# Patient Record
Sex: Female | Born: 1960 | Race: White | Hispanic: No | Marital: Single | Smoking: Former smoker
Health system: Southern US, Community
[De-identification: ages and names within clinical notes are randomized; demographics above are authoritative.]

## PROBLEM LIST (undated history)

## (undated) DIAGNOSIS — T884XXA Failed or difficult intubation, initial encounter: Secondary | ICD-10-CM

## (undated) DIAGNOSIS — M1990 Unspecified osteoarthritis, unspecified site: Secondary | ICD-10-CM

## (undated) DIAGNOSIS — F419 Anxiety disorder, unspecified: Secondary | ICD-10-CM

## (undated) HISTORY — PX: CHOLECYSTECTOMY: SHX55

## (undated) HISTORY — PX: TUBAL LIGATION: SHX77

---

## 2005-03-27 ENCOUNTER — Emergency Department: Payer: Self-pay | Admitting: Unknown Physician Specialty

## 2009-12-21 ENCOUNTER — Ambulatory Visit: Payer: Self-pay

## 2013-03-05 ENCOUNTER — Ambulatory Visit: Payer: Self-pay

## 2013-04-02 ENCOUNTER — Ambulatory Visit: Payer: Self-pay

## 2014-06-10 ENCOUNTER — Ambulatory Visit: Payer: Self-pay

## 2015-06-23 ENCOUNTER — Ambulatory Visit: Payer: Self-pay | Attending: Oncology

## 2015-06-23 ENCOUNTER — Ambulatory Visit
Admission: RE | Admit: 2015-06-23 | Discharge: 2015-06-23 | Disposition: A | Discharge status: Home / Self Care | Payer: Self-pay | Source: Ambulatory Visit | Attending: Oncology | Admitting: Oncology

## 2015-06-23 ENCOUNTER — Other Ambulatory Visit: Payer: Self-pay | Admitting: Oncology

## 2015-06-23 VITALS — BP 138/65 | HR 106 | Temp 98.9°F | Ht 60.24 in | Wt 147.7 lb

## 2015-06-23 DIAGNOSIS — Z0000 Encounter for general adult medical examination without abnormal findings: Secondary | ICD-10-CM

## 2015-06-23 NOTE — Progress Notes (Addendum)
Subjective:     Patient ID: Teresa Dudley, female   DOB: 06-15-1960, 55 y.o.   MRN: [id number]  HPI   Review of Systems     Objective:   Physical Exam  Pulmonary/Chest: Right breast exhibits no inverted nipple, no mass, no nipple discharge, no skin change and no tenderness. Left breast exhibits no inverted nipple, no mass, no nipple discharge, no skin change and no tenderness. Breasts are symmetrical.  Genitourinary: No labial fusion. There is no rash, tenderness, lesion or injury on the right labia. There is no rash, tenderness, lesion or injury on the left labia. Uterus is not deviated, not enlarged, not fixed and not tender. Cervix exhibits no motion tenderness, no discharge and no friability. Right adnexum displays no mass, no tenderness and no fullness. Left adnexum displays no mass, no tenderness and no fullness. No erythema, tenderness or bleeding in the vagina. No foreign body around the vagina. No signs of injury around the vagina. No vaginal discharge found.       Assessment:     55 year old female returns for BCCCP screening. Patient still works at [employer].  Patient screened, and meets BCCCP eligibility.  Patient does not have insurance, Medicare or Medicaid.  Handout given on Affordable Care Act.Instructed patient on breast self-exam using teach back method.  CBE unremarkable .  No mass or lump palpated.  Slight inversion of left areola when arms elevated.  Patient reports this is normal for her.   Pelvic exam normal.  Patient will be due for pap next year. Plan:     Sent for bilateral screening mammogram.

## 2015-06-29 NOTE — Progress Notes (Signed)
Letter mailed from [facility] to notify of normal mammogram results.  Patient to return in one year for annual screening.  Copy to [facility]. 

## 2015-12-19 ENCOUNTER — Encounter
Admission: EM | Disposition: A | Discharge status: Home / Self Care | Payer: Self-pay | Source: Home / Self Care | Attending: Student

## 2015-12-19 ENCOUNTER — Inpatient Hospital Stay
Admission: EM | Admit: 2015-12-19 | Discharge: 2015-12-21 | DRG: 983 | Disposition: A | Discharge status: Home / Self Care | Payer: BLUE CROSS/BLUE SHIELD | Attending: Student | Admitting: Student

## 2015-12-19 ENCOUNTER — Inpatient Hospital Stay: Payer: BLUE CROSS/BLUE SHIELD | Admitting: Anesthesiology

## 2015-12-19 ENCOUNTER — Emergency Department: Payer: BLUE CROSS/BLUE SHIELD

## 2015-12-19 ENCOUNTER — Encounter: Payer: Self-pay | Admitting: Emergency Medicine

## 2015-12-19 ENCOUNTER — Ambulatory Visit
Admission: EM | Admit: 2015-12-19 | Discharge: 2015-12-19 | Disposition: A | Discharge status: Home / Self Care | Payer: BLUE CROSS/BLUE SHIELD | Source: Home / Self Care

## 2015-12-19 DIAGNOSIS — W5501XA Bitten by cat, initial encounter: Secondary | ICD-10-CM

## 2015-12-19 DIAGNOSIS — Z23 Encounter for immunization: Secondary | ICD-10-CM

## 2015-12-19 DIAGNOSIS — S61254A Open bite of right ring finger without damage to nail, initial encounter: Principal | ICD-10-CM | POA: Diagnosis present

## 2015-12-19 DIAGNOSIS — M65841 Other synovitis and tenosynovitis, right hand: Secondary | ICD-10-CM | POA: Diagnosis present

## 2015-12-19 DIAGNOSIS — M6588 Other synovitis and tenosynovitis, other site: Secondary | ICD-10-CM | POA: Diagnosis present

## 2015-12-19 DIAGNOSIS — M659 Synovitis and tenosynovitis, unspecified: Secondary | ICD-10-CM | POA: Diagnosis present

## 2015-12-19 HISTORY — PX: INCISION AND DRAINAGE OF WOUND: SHX1803

## 2015-12-19 LAB — COMPREHENSIVE METABOLIC PANEL
ALK PHOS: 137 U/L — AB (ref 38–126)
ALT: 24 U/L (ref 14–54)
ANION GAP: 10 (ref 5–15)
AST: 32 U/L (ref 15–41)
Albumin: 4.5 g/dL (ref 3.5–5.0)
BILIRUBIN TOTAL: 1 mg/dL (ref 0.3–1.2)
BUN: 12 mg/dL (ref 6–20)
CALCIUM: 9.2 mg/dL (ref 8.9–10.3)
CO2: 22 mmol/L (ref 22–32)
Chloride: 105 mmol/L (ref 101–111)
Creatinine, Ser: 0.63 mg/dL (ref 0.44–1.00)
GFR calc Af Amer: >60 mL/min (ref >60)
Glucose, Bld: 104 mg/dL — ABNORMAL HIGH (ref 65–99)
POTASSIUM: 4 mmol/L (ref 3.5–5.1)
Sodium: 137 mmol/L (ref 135–145)
TOTAL PROTEIN: 8 g/dL (ref 6.5–8.1)

## 2015-12-19 LAB — CBC WITH DIFFERENTIAL/PLATELET
BASOS PCT: 0 %
Basophils Absolute: 0 10*3/uL (ref 0–0.1)
Eosinophils Absolute: 0 10*3/uL (ref 0–0.7)
Eosinophils Relative: 0 %
HEMATOCRIT: 41.1 % (ref 35.0–47.0)
Hemoglobin: 13.9 g/dL (ref 12.0–16.0)
LYMPHS ABS: 1.6 10*3/uL (ref 1.0–3.6)
LYMPHS PCT: 11 %
MCH: 28.8 pg (ref 26.0–34.0)
MCHC: 33.8 g/dL (ref 32.0–36.0)
MCV: 85.2 fL (ref 80.0–100.0)
MONO ABS: 0.8 10*3/uL (ref 0.2–0.9)
MONOS PCT: 5 %
NEUTROS ABS: 12.4 10*3/uL — AB (ref 1.4–6.5)
Neutrophils Relative %: 84 %
Platelets: 277 10*3/uL (ref 150–440)
RBC: 4.83 MIL/uL (ref 3.80–5.20)
RDW: 13.5 % (ref 11.5–14.5)
WBC: 14.8 10*3/uL — ABNORMAL HIGH (ref 3.6–11.0)

## 2015-12-19 SURGERY — IRRIGATION AND DEBRIDEMENT WOUND
Anesthesia: General | Laterality: Right

## 2015-12-19 MED ORDER — DEXAMETHASONE SODIUM PHOSPHATE 10 MG/ML IJ SOLN
INTRAMUSCULAR | Status: DC | PRN
  Administered 2015-12-19: 10 mg via INTRAVENOUS

## 2015-12-19 MED ORDER — FENTANYL CITRATE (PF) 100 MCG/2ML IJ SOLN
INTRAMUSCULAR | Status: AC
  Filled 2015-12-19: qty 2

## 2015-12-19 MED ORDER — TETANUS-DIPHTH-ACELL PERTUSSIS 5-2.5-18.5 LF-MCG/0.5 IM SUSP
0.5000 mL | Freq: Once | INTRAMUSCULAR | Status: AC
  Administered 2015-12-19: 0.5 mL via INTRAMUSCULAR
  Filled 2015-12-19 (×2): qty 0.5

## 2015-12-19 MED ORDER — ONDANSETRON HCL 4 MG/2ML IJ SOLN: 4.0000 mg | Freq: Once | INTRAMUSCULAR | Status: DC | PRN

## 2015-12-19 MED ORDER — LACTATED RINGERS IV SOLN
INTRAVENOUS | Status: DC | PRN
  Administered 2015-12-19: 18:00:00 via INTRAVENOUS

## 2015-12-19 MED ORDER — PROPOFOL 10 MG/ML IV BOLUS
INTRAVENOUS | Status: DC | PRN
  Administered 2015-12-19: 25 mg via INTRAVENOUS
  Administered 2015-12-19: 150 mg via INTRAVENOUS
  Administered 2015-12-19: 25 mg via INTRAVENOUS

## 2015-12-19 MED ORDER — TRAZODONE HCL 100 MG PO TABS
100.0000 mg | ORAL_TABLET | Freq: Every day | ORAL | Status: DC
  Administered 2015-12-19 – 2015-12-20 (×2): 100 mg via ORAL
  Filled 2015-12-19 (×2): qty 1

## 2015-12-19 MED ORDER — MIDAZOLAM HCL 2 MG/2ML IJ SOLN
INTRAMUSCULAR | Status: DC | PRN
  Administered 2015-12-19: 2 mg via INTRAVENOUS

## 2015-12-19 MED ORDER — ONDANSETRON HCL 4 MG/2ML IJ SOLN
INTRAMUSCULAR | Status: DC | PRN
  Administered 2015-12-19: 4 mg via INTRAVENOUS

## 2015-12-19 MED ORDER — MORPHINE SULFATE (PF) 2 MG/ML IV SOLN: 2.0000 mg | INTRAVENOUS | Status: DC | PRN

## 2015-12-19 MED ORDER — DEXTROSE-NACL 5-0.45 % IV SOLN
INTRAVENOUS | Status: DC
  Administered 2015-12-19: 20:00:00 via INTRAVENOUS

## 2015-12-19 MED ORDER — SODIUM CHLORIDE 0.9 % IV SOLN
3.0000 g | Freq: Four times a day (QID) | INTRAVENOUS | Status: DC
  Administered 2015-12-19 – 2015-12-21 (×5): 3 g via INTRAVENOUS
  Filled 2015-12-19 (×10): qty 3

## 2015-12-19 MED ORDER — FENTANYL CITRATE (PF) 100 MCG/2ML IJ SOLN
25.0000 ug | INTRAMUSCULAR | Status: DC | PRN
  Administered 2015-12-19 (×4): 25 ug via INTRAVENOUS

## 2015-12-19 MED ORDER — KETOROLAC TROMETHAMINE 15 MG/ML IJ SOLN
15.0000 mg | Freq: Four times a day (QID) | INTRAMUSCULAR | Status: DC
Start: 2015-12-19 — End: 2015-12-21
  Administered 2015-12-20 – 2015-12-21 (×5): 15 mg via INTRAVENOUS
  Filled 2015-12-19 (×6): qty 1

## 2015-12-19 MED ORDER — VANCOMYCIN HCL IN DEXTROSE 1-5 GM/200ML-% IV SOLN: 1000.0000 mg | Freq: Two times a day (BID) | INTRAVENOUS | Status: DC

## 2015-12-19 MED ORDER — FENTANYL CITRATE (PF) 100 MCG/2ML IJ SOLN
INTRAMUSCULAR | Status: DC | PRN
  Administered 2015-12-19 (×3): 50 ug via INTRAVENOUS

## 2015-12-19 MED ORDER — SODIUM CHLORIDE 0.9 % IV SOLN
3.0000 g | INTRAVENOUS | Status: AC
  Administered 2015-12-19: 3 g via INTRAVENOUS
  Filled 2015-12-19 (×2): qty 3

## 2015-12-19 MED ORDER — VANCOMYCIN HCL IN DEXTROSE 750-5 MG/150ML-% IV SOLN
750.0000 mg | Freq: Two times a day (BID) | INTRAVENOUS | Status: DC
  Administered 2015-12-20 – 2015-12-21 (×3): 750 mg via INTRAVENOUS
  Filled 2015-12-19 (×5): qty 150

## 2015-12-19 MED ORDER — OXYCODONE HCL 5 MG PO TABS
5.0000 mg | ORAL_TABLET | ORAL | Status: DC | PRN
  Administered 2015-12-20 – 2015-12-21 (×3): 5 mg via ORAL
  Filled 2015-12-19 (×3): qty 1

## 2015-12-19 MED ORDER — VANCOMYCIN HCL IN DEXTROSE 1-5 GM/200ML-% IV SOLN
1000.0000 mg | Freq: Once | INTRAVENOUS | Status: AC
Start: 2015-12-19 — End: 2015-12-19
  Administered 2015-12-19: 1000 mg via INTRAVENOUS
  Filled 2015-12-19: qty 200

## 2015-12-19 MED ORDER — LIDOCAINE HCL (CARDIAC) 20 MG/ML IV SOLN
INTRAVENOUS | Status: DC | PRN
  Administered 2015-12-19: 100 mg via INTRAVENOUS

## 2015-12-19 MED ORDER — ACETAMINOPHEN 325 MG PO TABS
650.0000 mg | ORAL_TABLET | Freq: Four times a day (QID) | ORAL | Status: DC | PRN
  Administered 2015-12-20: 650 mg via ORAL
  Filled 2015-12-19: qty 2

## 2015-12-19 MED ORDER — KETOROLAC TROMETHAMINE 30 MG/ML IJ SOLN
INTRAMUSCULAR | Status: DC | PRN
  Administered 2015-12-19: 30 mg via INTRAVENOUS

## 2015-12-19 SURGICAL SUPPLY — 28 items
BANDAGE CONFORM 2X5YD N/S (GAUZE/BANDAGES/DRESSINGS) IMPLANT
BANDAGE ELASTIC 3 LF NS (GAUZE/BANDAGES/DRESSINGS) IMPLANT
BLADE SURG MINI STRL (BLADE) ×2 IMPLANT
BNDG COHESIVE 6X5 TAN STRL LF (GAUZE/BANDAGES/DRESSINGS) ×2 IMPLANT
BNDG ESMARK 4X12 TAN STRL LF (GAUZE/BANDAGES/DRESSINGS) ×2 IMPLANT
CANISTER SUCT 1200ML W/VALVE (MISCELLANEOUS) ×2 IMPLANT
CAST PADDING 3X4FT ST 30246 (SOFTGOODS)
CHLORAPREP W/TINT 26ML (MISCELLANEOUS) ×2 IMPLANT
CUFF TOURN 18 STER (MISCELLANEOUS) ×2 IMPLANT
DRAPE U-SHAPE 47X51 STRL (DRAPES) ×2 IMPLANT
ELECT REM PT RETURN 9FT ADLT (ELECTROSURGICAL) ×2
ELECTRODE REM PT RTRN 9FT ADLT (ELECTROSURGICAL) ×1 IMPLANT
GAUZE PETRO XEROFOAM 1X8 (MISCELLANEOUS) IMPLANT
GAUZE SPONGE 4X4 12PLY STRL (GAUZE/BANDAGES/DRESSINGS) ×2 IMPLANT
GLOVE BIO SURGEON STRL SZ7.5 (GLOVE) ×8 IMPLANT
GOWN STRL REUS W/ TWL LRG LVL3 (GOWN DISPOSABLE) ×2 IMPLANT
GOWN STRL REUS W/TWL LRG LVL3 (GOWN DISPOSABLE) ×2
IV CATH ANGIO 14GX3.25 ORG (MISCELLANEOUS) ×6 IMPLANT
KIT RM TURNOVER STRD PROC AR (KITS) ×2 IMPLANT
NS IRRIG 500ML POUR BTL (IV SOLUTION) ×2 IMPLANT
PACK EXTREMITY ARMC (MISCELLANEOUS) ×2 IMPLANT
PAD CAST CTTN 3X4 STRL (SOFTGOODS) IMPLANT
PAD PREP 24X41 OB/GYN DISP (PERSONAL CARE ITEMS) ×2 IMPLANT
STOCKINETTE STRL 4IN 9604848 (GAUZE/BANDAGES/DRESSINGS) ×2 IMPLANT
SUT ETHILON 3-0 FS-10 30 BLK (SUTURE) ×2
SUT ETHILON 4 0 P 3 18 (SUTURE) IMPLANT
SUTURE EHLN 3-0 FS-10 30 BLK (SUTURE) ×1 IMPLANT
SYR 50ML LL SCALE MARK (SYRINGE) ×2 IMPLANT

## 2015-12-19 NOTE — Anesthesia Procedure Notes (Signed)
Procedure Name: LMA Insertion Date/Time: 12/19/2015 6:12 PM Performed by: Junious Silk Pre-anesthesia Checklist: Patient identified, Patient being monitored, Timeout performed, Emergency Drugs available and Suction available Patient Re-evaluated:Patient Re-evaluated prior to inductionOxygen Delivery Method: Circle system utilized Preoxygenation: Pre-oxygenation with 100% oxygen Intubation Type: IV induction Ventilation: Mask ventilation without difficulty LMA: LMA inserted LMA Size: 3.5 Tube type: Oral Number of attempts: 1 Placement Confirmation: positive ETCO2 and breath sounds checked- equal and bilateral Tube secured with: Tape Dental Injury: Teeth and Oropharynx as per pre-operative assessment

## 2015-12-19 NOTE — ED Notes (Signed)
[place] [employer] notified via [employer] of animal bite.

## 2015-12-19 NOTE — Anesthesia Preprocedure Evaluation (Signed)
Anesthesia Evaluation  Patient identified by MRN, date of birth, ID band Patient awake    Reviewed: Allergy & Precautions, H&P , NPO status , Patient's Chart, lab work & pertinent test results, reviewed documented beta blocker date and time   Airway Mallampati: II  TM Distance: >3 FB Neck ROM: full    Dental  (+) Teeth Intact   Pulmonary neg pulmonary ROS,    Pulmonary exam normal        Cardiovascular Exercise Tolerance: Good negative cardio ROS Normal cardiovascular exam Rate:Normal     Neuro/Psych negative neurological ROS  negative psych ROS   GI/Hepatic negative GI ROS, Neg liver ROS,   Endo/Other  negative endocrine ROS  Renal/GU negative Renal ROS  negative genitourinary   Musculoskeletal   Abdominal   Peds  Hematology negative hematology ROS (+)   Anesthesia Other Findings   Reproductive/Obstetrics negative OB ROS                             Anesthesia Physical Anesthesia Plan  ASA: II and emergent  Anesthesia Plan: General LMA   Post-op Pain Management:    Induction:   Airway Management Planned:   Additional Equipment:   Intra-op Plan:   Post-operative Plan:   Informed Consent: I have reviewed the patients History and Physical, chart, labs and discussed the procedure including the risks, benefits and alternatives for the proposed anesthesia with the patient or authorized representative who has indicated his/her understanding and acceptance.     Plan Discussed with: CRNA  Anesthesia Plan Comments:         Anesthesia Quick Evaluation

## 2015-12-19 NOTE — Progress Notes (Signed)
Patient arrived to floor from procedure. Patient alert and oriented x4. Dressing CDI. Ice pack applied and elevated on pillows. IV fluids infusing.

## 2015-12-19 NOTE — Progress Notes (Signed)
Pharmacy Antibiotic Note  Teresa Dudley is a 55 y.o. female admitted on 12/19/2015 with wound infecton (cat bite).  Pharmacy has been consulted for vancomycin dosing.  Plan: Patient received vancomycin 1000 mg dose in ED. Will follow with vancomycin 750 mg IV q12h (10 hour stacked dose) Goal vancomycin trough 15-20 mcg/mL Vancomycin trough scheduled for 8/1 @ 1230, which is prior to 5th dose and should represent steady state.  Height: 4\' 11"  (149.9 cm) Weight: 135 lb (61.2 kg) IBW/kg (Calculated) : 43.2  Temp (24hrs), Avg:98.7 F (37.1 C), Min:98.1 F (36.7 C), Max:99.5 F (37.5 C)   Recent Labs Lab 12/19/15 1239  WBC 14.8*  CREATININE 0.63    Estimated Creatinine Clearance: 64.7 mL/min (by C-G formula based on SCr of 0.8 mg/dL).    No Known Allergies  Antimicrobials this admission: Ampicillin/sulbactam 7/30 >>  vancomycin 7/30 >>   Dose adjustments this admission:  Microbiology results:  Thank you for allowing pharmacy to be a part of this patient's care.  Cindi Carbon, PharmD 12/19/2015 8:58 PM

## 2015-12-19 NOTE — ED Provider Notes (Signed)
[facility] Emergency Department Provider Note  ____________________________________________   First MD Initiated Contact with Patient 12/19/15 1405     (approximate)  I have reviewed the triage vital signs and the nursing notes.   HISTORY  Chief Complaint Animal Bite    HPI Teresa Dudley is a 55 y.o. female who denies any significant chronic medical history and presents for evaluation of a cat bite on her right hand.  She reports that her cat is elderly, lives in a house with her at all times and has no contact with other animals, and was startled when she went to pick it up last night.  The cat bit her directly and deeply on the PIP joint of her right ring finger.  She states that the cat "really dug in" and that deeply into the joint.  She has some superficial scratches of her hand as well.  She washed the wound thoroughly and applied some triple antibiotic ointment, but she was awake all night because of the aching and throbbing of her hand.  Relatively rapidly over the last 12 hours the finger has swollen and turned red and is held in slight flexion.  Additionally the MCP joint of the affected finger and the surrounding fingers are now red and swollen and tender as well.  She reports the pain is severe and worse with any movement of the hand.  She denies fever/chills, chest pain, shortness of breath, nausea, vomiting.  Her last tetanus vaccination was 11 years ago.  The emergency department nurse is coordinating with the sheriff's department/animal control regarding the incident and a questionable need for rabies vaccination.  The patient is left-hand dominant.   History reviewed. No pertinent past medical history.  There are no active problems to display for this patient.   Past Surgical History:  Procedure Laterality Date  . CHOLECYSTECTOMY    . TUBAL LIGATION      Prior to Admission medications   Medication Sig Start Date End Date Taking?  Authorizing Provider  naproxen (NAPROSYN) 500 MG tablet Take 500 mg by mouth every 12 (twelve) hours as needed.   Yes Historical Provider, MD  traZODone (DESYREL) 100 MG tablet Take 100 mg by mouth at bedtime.   Yes Historical Provider, MD    Allergies Review of patient's allergies indicates no known allergies.  Family History  Problem Relation Age of Onset  . Breast cancer Daughter 3    Social History Social History  Substance Use Topics  . Smoking status: Never Smoker  . Smokeless tobacco: Never Used  . Alcohol use No    Review of Systems Constitutional: No fever/chills Eyes: No visual changes. ENT: No sore throat. Cardiovascular: Denies chest pain. Respiratory: Denies shortness of breath. Gastrointestinal: No abdominal pain.  No nausea, no vomiting.  No diarrhea.  No constipation. Genitourinary: Negative for dysuria. Musculoskeletal: Rapidly worsening pain, redness, swelling in right hand after cat bite to ring finger PIP about 13 hours ago Skin: Negative for rash. Neurological: Negative for headaches, focal weakness or numbness.  10-point ROS otherwise negative.  ____________________________________________   PHYSICAL EXAM:  VITAL SIGNS: ED Triage Vitals  Enc Vitals Group     BP 12/19/15 1229 (!) 150/95     Pulse Rate 12/19/15 1229 (!) 103     Resp 12/19/15 1229 20     Temp 12/19/15 1229 99 F (37.2 C)     Temp Source 12/19/15 1229 Oral     SpO2 12/19/15 1229 96 %  Weight 12/19/15 1229 135 lb (61.2 kg)     Height 12/19/15 1229  (1.499 m)     Head Circumference --      Peak Flow --      Pain Score 12/19/15 1241 10     Pain Loc --      Pain Edu? --      Excl. in GC? --     Constitutional: Alert and oriented. Well appearing and in no acute distress but does appear uncomfortable Eyes: Conjunctivae are normal. PERRL. EOMI. Head: Atraumatic. Nose: No congestion/rhinnorhea. Neck: No stridor.  No meningeal signs.   Cardiovascular: Normal rate,  regular rhythm. Good peripheral circulation. Grossly normal heart sounds.   Respiratory: Normal respiratory effort.  No retractions. Lungs CTAB. Gastrointestinal: Soft and nontender. No distention.  Musculoskeletal: circumferential swelling nd erythemaof the right ring finger with obvious bite mark directly on the PIP.  The swelling extends distally to the DIP and proximally past the MCP.  She is holding the finger in flexion and there is severe pain with passive extension.  There is tenderness to palpation throughout the finger along the tendon sheath.  The redness and swelling include the MCP of the third, fourth, and fifth fingers of the right hand and there is tenderness to palpation of the palmar and dorsal aspects of the hand in that same distribution Neurologic:  Normal speech and language. No gross focal neurologic deficits are appreciated.  Skin:  Skin is warm, dry and intact except as described above Psychiatric: Mood and affect are normal. Speech and behavior are normal.  ____________________________________________   LABS (all labs ordered are listed, but only abnormal results are displayed)  Labs Reviewed  CBC WITH DIFFERENTIAL/PLATELET - Abnormal; Notable for the following:       Result Value   WBC 14.8 (*)    Neutro Abs 12.4 (*)    All other components within normal limits  COMPREHENSIVE METABOLIC PANEL - Abnormal; Notable for the following:    Glucose, Bld 104 (*)    Alkaline Phosphatase 137 (*)    All other components within normal limits   ____________________________________________  EKG  None ____________________________________________  RADIOLOGY   Dg Hand Complete Right  Result Date: 12/19/2015 CLINICAL DATA:  Cat bite yesterday in the fourth digit, initial encounter EXAM: RIGHT HAND - COMPLETE 3+ VIEW COMPARISON:  None. FINDINGS: Swelling of the fourth digit is noted consistent with the given clinical history. Densities are noted along the medial skin  surface of uncertain significance. No acute fracture is noted. No other focal abnormality is seen. IMPRESSION: Soft tissue swelling without acute bony abnormality. Electronically Signed   By: Alcide Clever M.D.   On: 12/19/2015 14:45   ____________________________________________   PROCEDURES  Procedure(s) performed:   Procedures   ____________________________________________   INITIAL IMPRESSION / ASSESSMENT AND PLAN / ED COURSE  Pertinent labs & imaging results that were available during my care of the patient were reviewed by me and considered in my medical decision making (see chart for details).  The patient was bitten by her own cat directly into the right ring finger PIP. Within the last 12-13 hours she has developed all 4 Kanavel signs and has a mild leukocytosis.  Vital signs are stable.  I will treat her aggressively with Unasyn 3 g IV, vancomycin 1 g IV, and a Tdap given that her last vaccination was 11 years ago.  We coordinated with the sheriff's department and we all agree that she does not require  rabies vaccination given that the cat is her own, lives in side at all times and does not have any contact with other animals, and can easily be observed.  I am concerned about rapidly developing flexor tenosynovitis and have paged orthopedics but I anticipate the need to contact a hand specialist if requested by our local orthopedic surgeon.  Also giving morphine and Zofran.  Clinical Course  Comment By Time  I discussed the case by phone with the on-call orthopedic surgeon, Dr. Cloyde Reams.  Dr. Cloyde Reams is comfortable with treating the patient for flexor tenosynovitis, but he is checking with Dr. Ernest Pine to determine follow-up since frequently our local orthopedic surgeon's request that we transfer hand issues to other facilities.  He will call me back.  Updated patient. Loleta Rose, MD 07/30 1527  I spoke by phone again with Dr. Cloyde Reams and he is comfortable admitting the patient for  further management and likely washing out the wound in the operating room.  I updated the patient and she is pleased with being able to stay at [facility].  Vital signs stable at this time. Loleta Rose, MD 07/30 1546    ____________________________________________  FINAL CLINICAL IMPRESSION(S) / ED DIAGNOSES  Final diagnoses:  Flexor tenosynovitis of finger  Cat bite, initial encounter     MEDICATIONS GIVEN DURING THIS VISIT:  Medications  vancomycin (VANCOCIN) IVPB 1000 mg/200 mL premix (1,000 mg Intravenous New Bag/Given 12/19/15 1450)  Tdap (BOOSTRIX) injection 0.5 mL (0.5 mLs Intramuscular Given 12/19/15 1539)  Ampicillin-Sulbactam (UNASYN) 3 g in sodium chloride 0.9 % 100 mL IVPB (0 g Intravenous Stopped 12/19/15 1544)     NEW OUTPATIENT MEDICATIONS STARTED DURING THIS VISIT:  New Prescriptions   No medications on file      Note:  This document was prepared using Dragon voice recognition software and may include unintentional dictation errors.    Loleta Rose, MD 12/19/15 [phone]

## 2015-12-19 NOTE — H&P (Signed)
ORTHOPAEDIC CONSULTATION  PATIENT NAME: Teresa Dudley DOB: 01/25/1961  MRN: [id number]  REQUESTING PHYSICIAN: Cory Forbach, MD  Chief Complaint: Right ring finger pain/swelling   HPI: Teresa Dudley is a 55 y.o. female seen in consultation at the request of Cory Forbach, MD for right ring finger pain and swelling.  Her cat bit her yesterday afternoon in the right ring finger PIP after she startled the cat.  Pain and swelling have worsened throughout the day and have spread into her MCP joint and the palm of the hand.  She remains afebrile.  She has received Vancomycin and Unasyn in the emergency department.  History reviewed. No pertinent past medical history. Past Surgical History:  Procedure Laterality Date  . CHOLECYSTECTOMY    . TUBAL LIGATION     Social History   Social History  . Marital status: Single    Spouse name: N/A  . Number of children: N/A  . Years of education: N/A   Social History Main Topics  . Smoking status: Never Smoker  . Smokeless tobacco: Never Used  . Alcohol use No  . Drug use: No  . Sexual activity: Not Asked   Other Topics Concern  . None   Social History Narrative  . None   Family History  Problem Relation Age of Onset  . Breast cancer Daughter 29   No Known Allergies Prior to Admission medications   Medication Sig Start Date End Date Taking? Authorizing Provider  naproxen (NAPROSYN) 500 MG tablet Take 500 mg by mouth every 12 (twelve) hours as needed.   Yes Historical Provider, MD  traZODone (DESYREL) 100 MG tablet Take 100 mg by mouth at bedtime.   Yes Historical Provider, MD    ROS: Denies fevers or chills  Physical Exam: Vitals:   12/19/15 1229 12/19/15 1500  BP: (!) 150/95 (!) 142/88  Pulse: (!) 103 94  Resp: 20 16  Temp: 99 F (37.2 C)    General: Alert and alert in no acute distress. HEENT: Atraumatic and normocephalic. Sclera are clear. Lungs: Clear to auscultation bilaterally. Cardiovascular: Palpable radial pulses,  finger warm and well perfused. MUSCULOSKELETAL: 2 puncture wounds (dorsally and radially) over the right ring finger PIP joint.  The PIP joint is swollen.  There is erythema spreading into the MCP joints.  There is fusiform swelling of the entire digit with swelling and tenderness spreading into the palm  Imaging: Dg Hand Complete Right Result Date: 12/19/2015 CLINICAL DATA:  Cat bite yesterday in the fourth digit, initial encounter EXAM: RIGHT HAND - COMPLETE 3+ VIEW COMPARISON:  None. FINDINGS: Swelling of the fourth digit is noted consistent with the given clinical history. Densities are noted along the medial skin surface of uncertain significance. No acute fracture is noted. No other focal abnormality is seen. IMPRESSION: Soft tissue swelling without acute bony abnormality. Electronically Signed   By: Mark  Lukens M.D.   On: 12/19/2015 14:45   Assessment: 55 year old female with right ring finger flexor tenosynovitis after cat bite.  Plan: - I defer rabies treatment to the emergency department physician, who has deemed that prophylactic treatment is not necessary at this time. - We will proceed urgently to the operating room for irrigation and debridement.   - I discussed the surgical plan in detail with the patient.  She understands the risks of postoperative pain, nerve injury, stiffness, worsening infection, and loss of the digit.  We discussed that non-surgical management is not in the best interest of the finger   at this time. - I will continue Vancomycin and Unasyn until culture-directed antibiotics can be determined.  Charmain Diosdado K. Kayonna Lawniczak, MD   

## 2015-12-19 NOTE — ED Triage Notes (Signed)
Pt presents to ED with cat bite that happened last night. Pt states went to [facility]. Pt presents with swelling and redness to her R ring finger. Pt states she is unable to move it and has began to lose feeling. Pt states that her cat bit her last night and has lapsed on Rabies Immunizations.

## 2015-12-19 NOTE — Op Note (Addendum)
OPERATIVE NOTE  Surgeon: Oliva Bustard. Cloyde Reams, MD  Date of surgery:  12/19/2015  PATIENT NAME:  Teresa Dudley   DOB: 1961-04-12  MRN: [id number]  Preoperative diagnosis:  Right ring finger flexor tenosynovitis    Postoperative diagnosis:  Same  Procedure: Debridement and Irrigation of right ring finger flexor tenosynovitis  Anesthesia: General  Estimated blood loss: minimal  Complications: none apparent  Specimens: Purulent drainage to microbiology X 2  Tourniquet time: 29 minutes  Indications for surgery: Teresa Dudley is a 55 y.o. year old female who sustained a cat bite to her finger yesterday evening.   She has developed flexor tenosynovitis and I have recommended urgent drainage and irrigation.  After discussion of the risks and benefits of surgical intervention, the patient expressed understanding.  She understands the risks of postoperative pain, nerve injury, stiffness, worsening infection, and loss of the digit. We discussed that non-surgical management is not in the best interest of the finger at this time.  Procedure in detail:  The patient was seen in the preoperative holding area. The operative extremity had previously been marked by the surgeon. The patient was transported to the operating room by the anesthesia staff. A preinduction timeout was performed by all members of the surgical team. After the above-noted anesthesia was induced, the patient was moved to the operating room table.  The patient was placed in the supine position.  An arm tourniquet was placed on the proximal aspect of the operative extremity. The operative extremity was prepped and draped in the usual sterile fashion. A second pre-incisional timeout was performed by all members of the surgical team.  A 1cm oblique incision was placed on the volar aspect of the distal palm just proximal to the MCP joint.  Blunt spreading was used to localize the A1 pulley, which was partially released.  A counter-incision was  placed on the ulnar side of the right ring finger at the level of the distal phalanx.  Blunt spreading was utilized to the level of the distal phalanx into the flexor sheath.  At this juncture I used spreading dissection through the cat puncture wound to reach the capsule of the PIP joint. The puncture wound was clearly intra-articular.  There was purulent drainage from the joint space.  A 14 gauge angiocath was inserted into the flexor sheath from the proximal incision.  Approximately of saline was irrigated through the flexor sheath and the wounds, with egress from the other incisions.  At the conclusion of the irrigation, there was no further purulence that could be expressed.  A sterile dressing was applied.  The patient was taken to the PACU in stable condition.  Postoperative Plan: The patient will remain on Unasyn and Vancomycin until cultures return.  She will work with hand therapy for aggressive ROM of the ring finger.  Her pain will be controlled.  She will receive IV Toradol scheduled every 6 hours for an anti-inflammatory.   Donyelle Enyeart K. Cloyde Reams, MD 12/19/2015

## 2015-12-19 NOTE — Consult Note (Signed)
ORTHOPAEDIC CONSULTATION  PATIENT NAME: Teresa Dudley DOB: Apr 18, 1961  MRN: [id number]  REQUESTING PHYSICIAN: Loleta Rose, MD  Chief Complaint: Right ring finger pain/swelling   HPI: Teresa Dudley is a 55 y.o. female seen in consultation at the request of Loleta Rose, MD for right ring finger pain and swelling.  Her cat bit her yesterday afternoon in the right ring finger PIP after she startled the cat.  Pain and swelling have worsened throughout the day and have spread into her MCP joint and the palm of the hand.  She remains afebrile.  She has received Vancomycin and Unasyn in the emergency department.  History reviewed. No pertinent past medical history. Past Surgical History:  Procedure Laterality Date  . CHOLECYSTECTOMY    . TUBAL LIGATION     Social History   Social History  . Marital status: Single    Spouse name: N/A  . Number of children: N/A  . Years of education: N/A   Social History Main Topics  . Smoking status: Never Smoker  . Smokeless tobacco: Never Used  . Alcohol use No  . Drug use: No  . Sexual activity: Not Asked   Other Topics Concern  . None   Social History Narrative  . None   Family History  Problem Relation Age of Onset  . Breast cancer Daughter 50   No Known Allergies Prior to Admission medications   Medication Sig Start Date End Date Taking? Authorizing Provider  naproxen (NAPROSYN) 500 MG tablet Take 500 mg by mouth every 12 (twelve) hours as needed.   Yes Historical Provider, MD  traZODone (DESYREL) 100 MG tablet Take 100 mg by mouth at bedtime.   Yes Historical Provider, MD    ROS: Denies fevers or chills  Physical Exam: Vitals:   12/19/15 1229 12/19/15 1500  BP: (!) 150/95 (!) 142/88  Pulse: (!) 103 94  Resp: 20 16  Temp: 99 F (37.2 C)    General: Alert and alert in no acute distress. HEENT: Atraumatic and normocephalic. Sclera are clear. Lungs: Clear to auscultation bilaterally. Cardiovascular: Palpable radial pulses,  finger warm and well perfused. MUSCULOSKELETAL: 2 puncture wounds (dorsally and radially) over the right ring finger PIP joint.  The PIP joint is swollen.  There is erythema spreading into the MCP joints.  There is fusiform swelling of the entire digit with swelling and tenderness spreading into the palm  Imaging: Dg Hand Complete Right Result Date: 12/19/2015 CLINICAL DATA:  Cat bite yesterday in the fourth digit, initial encounter EXAM: RIGHT HAND - COMPLETE 3+ VIEW COMPARISON:  None. FINDINGS: Swelling of the fourth digit is noted consistent with the given clinical history. Densities are noted along the medial skin surface of uncertain significance. No acute fracture is noted. No other focal abnormality is seen. IMPRESSION: Soft tissue swelling without acute bony abnormality. Electronically Signed   By: Alcide Clever M.D.   On: 12/19/2015 14:45   Assessment: 55 year old female with right ring finger flexor tenosynovitis after cat bite.  Plan: - I defer rabies treatment to the emergency department physician, who has deemed that prophylactic treatment is not necessary at this time. - We will proceed urgently to the operating room for irrigation and debridement.   - I discussed the surgical plan in detail with the patient.  She understands the risks of postoperative pain, nerve injury, stiffness, worsening infection, and loss of the digit.  We discussed that non-surgical management is not in the best interest of the finger  at this time. - I will continue Vancomycin and Unasyn until culture-directed antibiotics can be determined.  Baylin Gamblin K. Cloyde Reams, MD

## 2015-12-19 NOTE — Transfer of Care (Signed)
Immediate Anesthesia Transfer of Care Note  Patient: Teresa Dudley  Procedure(s) Performed: Procedure(s): IRRIGATION AND DEBRIDEMENT WOUND (Right)  Patient Location: PACU  Anesthesia Type:General  Level of Consciousness: sedated  Airway & Oxygen Therapy: Patient Spontanous Breathing and Patient connected to face mask oxygen  Post-op Assessment: Report given to RN and Post -op Vital signs reviewed and stable  Post vital signs: Reviewed and stable  Last Vitals:  Vitals:   12/19/15 1229 12/19/15 1500  BP: (!) 150/95 (!) 142/88  Pulse: (!) 103 94  Resp: 20 16  Temp: 37.2 C     Last Pain:  Vitals:   12/19/15 1500  TempSrc:   PainSc: 0-No pain         Complications: No apparent anesthesia complications

## 2015-12-20 ENCOUNTER — Encounter: Payer: Self-pay | Admitting: Student

## 2015-12-20 NOTE — Progress Notes (Signed)
ORTHOPAEDICS PROGRESS NOTE  PATIENT NAME: Teresa Dudley DOB: Jan 01, 1961  MRN: [id number]  POD # 1: Incision, irrigation, and debridement of the right ring finger  Subjective: Patient states that the pain is well-tolerated. She appreciated some decrease in swelling to the hand. She tolerated OT extremely well. She has been working on gentle range of motion exercises as instructed.  Objective: Vital signs in last 24 hours: Temp:  [97.5 F (36.4 C)-98.1 F (36.7 C)] 98.1 F (36.7 C) (07/31 1929) Pulse Rate:  [84-87] 84 (07/31 1929) Resp:  [18-19] 19 (07/31 0831) BP: (95-139)/(48-90) 139/72 (07/31 1929) SpO2:  [95 %-98 %] 98 % (07/31 1929)  EXAM General: Well-developed well-nourished female seen in no acute distress. Right upper extremity: Dressing is intact to the palm and ring finger. No ascending erythema or induration. Good capillary refill.  Assessment: Status post irrigation and debridement of the right ring finger following a cat bite  Active Problems:   Flexor tenosynovitis of finger   Plan: Wound cultures are pending at this time. Will continue with IV vancomycin and Unasyn pending wound cultures. Will change to oral antibiotics as per culture and sensitivities. Plan is to go Home after hospital stay. DVT Prophylaxis - None  James P. Angie Fava M.D.

## 2015-12-20 NOTE — Progress Notes (Signed)
PT Completion Note  Patient Details Name: Teresa Dudley MRN: [id number] DOB: 03-Feb-1961   Cancelled Treatment:    Reason Eval/Treat Not Completed: PT screened, no needs identified, will sign off. Chart reviewed. Pt interviewed who reports she has had no changes in her mobility since injury/surgery. PT observed pt ambulate independently in room without assistive device. No instability or safety concerns present. Pt will be safe to DC home when medically appropriate. Pt should follow-up with OP OT for hand therapy per ortho referral.  Sharalyn Ink Breelyn Icard PT, DPT   Verdie Wilms 12/20/2015, 10:03 AM

## 2015-12-20 NOTE — Progress Notes (Signed)
Pt resting comfortably this evening, pain well controlled with scheduled Toradol and Oxy 5mg  PRN. Dressing changed this shift, minimal drainage, bloody with slight purulent tinge. Participated with OT and PT. Tolerating IV abt with no adverse effects.  *Pt would like update from MD at rounds tomorrow. Please address whether pt requires a Rabies Vaccine since cat was out of date on vaccines. Tetanus vaccine was already given in ED. Patient would like to D/C tomorrow or Wed if possible.

## 2015-12-20 NOTE — Evaluation (Signed)
Occupational Therapy Evaluation Patient Details Name: Teresa Dudley MRN: [id number] DOB: 24-Dec-1960 Today's Date: 12/20/2015    History of Present Illness Pt. is a 55 y.o. female who was admitted to [facility] for surgical drainage, and debridement following a cat bite.   Clinical Impression   Pt. Is a 55 y.o. Female who was admitted to [facility] for antibiotics, surgical drainage, and debridement following a cat bite to right 4th digit PIP. Pt. Presents with increased edema in the hand, through the MP joints, and PIP,and DIP joints of the 3rd through 5th digits. Pt. also presents with 5/10 pain with ROM, limited RUE MP, PIP, and DIP flexion and extension, and limited RUE functional hand use. Pt. Is Left hand dominant, and is using her Left hand to complete most basic self-care tasks. Pt. Is unable to to perform ADL tasks which require incorporating her Right hand to complete. Pt. was able to tolerate AROM/AAROM/PROM to the right hand and digits. Pt. Education was provided about self-ROM techniques and elevation. Pt. Requires verbal cues, visual demonstration, and tactile cue for proper movement patterns. Pt. Nursing in for redressing hand.    Follow Up Recommendations  Outpatient OT (Hand Therapy)    Equipment Recommendations       Recommendations for Other Services       Precautions / Restrictions  No ROM or Weightbearing restrictions. Orders present for Elevation, Aggressive ROM, and to encourage functional use.     Mobility Bed Mobility                  Transfers                      Balance                                            ADL Overall ADL's : Needs assistance/impaired Eating/Feeding: Set up   Grooming: Set up with her dominant left hand.               Lower Body Dressing: Set up (Pt. requires assist for tasks requiring bilateral, and left hand use.)   Toilet Transfer: Independent           Functional mobility during ADLs:  Independent       Vision     Perception     Praxis      Pertinent Vitals/Pain       Hand Dominance Left   Extremity/Trunk Assessment Upper Extremity Assessment: Pt. Presents with increased edema through the MP joints, and into the digits. Pt. Presents with limited AROM, and PROM digit MP, PIP, and DIP flexion throughout the digits of the right hand. Pt. Is unable to form a composite fist.            Communication Communication Communication: No difficulties   Cognition Arousal/Alertness: Awake/alert Behavior During Therapy: WFL for tasks assessed/performed Overall Cognitive Status: Within Functional Limits for tasks assessed                     General Comments       Exercises       Shoulder Instructions      Home Living Family/patient expects to be discharged to:: Private residence Living Arrangements: Alone Available Help at Discharge: Family Type of Home: House Home Access: Stairs to enter [employer] of Steps: 2  Bathroom Shower/Tub: Theme park manager: Yes   Home Equipment: Tub bench          Prior Functioning/Environment Level of Independence: Independent             OT Diagnosis: Generalized weakness   OT Problem List: Decreased strength;Decreased activity tolerance;Decreased knowledge of use of DME or AE;Decreased range of motion;Pain;Impaired UE functional use   OT Treatment/Interventions: Self-care/ADL training;DME and/or AE instruction;Therapeutic exercise;Patient/family education    OT Goals(Current goals can be found in the care plan section) Acute Rehab OT Goals Patient Stated Goal: To regain the use of her right hand and fingers OT Goal Formulation: With patient Potential to Achieve Goals: Good  OT Frequency: Min 3X/week   Barriers to D/C:            Co-evaluation              End of Session    Activity Tolerance: Patient tolerated  treatment well Patient left: in bed;with call bell/phone within reach;with bed alarm set;with family/visitor present   Time: 1020-1115 OT Time Calculation (min): 55 min Charges:  OT Evaluation $OT Eval Moderate Complexity: 1 Procedure OT Treatments $Self Care/Home Management : 23-37 mins G-Codes:    Olegario Messier, MS, OTR/L Olegario Messier 12/20/2015, 11:48 AM

## 2015-12-21 LAB — BASIC METABOLIC PANEL
ANION GAP: 8 (ref 5–15)
BUN: 20 mg/dL (ref 6–20)
CHLORIDE: 107 mmol/L (ref 101–111)
CO2: 27 mmol/L (ref 22–32)
CREATININE: 0.64 mg/dL (ref 0.44–1.00)
Calcium: 8.4 mg/dL — ABNORMAL LOW (ref 8.9–10.3)
GFR calc non Af Amer: >60 mL/min (ref >60)
GLUCOSE: 112 mg/dL — AB (ref 65–99)
Potassium: 3.5 mmol/L (ref 3.5–5.1)
Sodium: 142 mmol/L (ref 135–145)

## 2015-12-21 MED ORDER — HYDROCODONE-ACETAMINOPHEN 5-325 MG PO TABS: 1.0000 | ORAL_TABLET | ORAL | Status: DC | PRN

## 2015-12-21 MED ORDER — HYDROCODONE-ACETAMINOPHEN 5-325 MG PO TABS: 1.0000 | ORAL_TABLET | ORAL | 0 refills | Status: DC | PRN

## 2015-12-21 MED ORDER — AMOXICILLIN-POT CLAVULANATE 875-125 MG PO TABS: 1.0000 | ORAL_TABLET | Freq: Two times a day (BID) | ORAL | 0 refills | Status: DC

## 2015-12-21 MED ORDER — AMOXICILLIN-POT CLAVULANATE 875-125 MG PO TABS
1.0000 | ORAL_TABLET | Freq: Two times a day (BID) | ORAL | Status: DC
  Administered 2015-12-21: 1 via ORAL
  Filled 2015-12-21: qty 1

## 2015-12-21 NOTE — Progress Notes (Signed)
   Subjective: 2 Days Post-Op Procedure(s) (LRB): IRRIGATION AND DEBRIDEMENT WOUND (Right) Patient reports pain as mild.   Patient is well, and has had no acute complaints or problems Plan is to go Home after hospital stay. no nausea and no vomiting Patient denies any chest pains or shortness of breath. Patient states she feels much better now. Feels that she is ready to go home.  Objective: Vital signs in last 24 hours: Temp:  [97.5 F (36.4 C)-98.1 F (36.7 C)] 98.1 F (36.7 C) (08/01 0542) Pulse Rate:  [81-87] 81 (08/01 0542) Resp:  [18-19] 18 (08/01 0542) BP: (108-139)/(62-90) 108/62 (08/01 0542) SpO2:  [95 %-99 %] 99 % (08/01 0542) well approximated incision Swelling significant improvement. No induration noted on today's visit. No drainage or any signs of infection. Still has some local swelling at the PIP joint. Moving fingers well. Capillary refill intact and within normal limits Heels are non tender and elevated off the bed using rolled towels Intake/Output from previous day: 07/31 0701 - 08/01 0700 In: 1660 [P.O.:960; IV Piggyback:700] Out: -  Intake/Output this shift: No intake/output data recorded.   Recent Labs  12/19/15 1239  HGB 13.9    Recent Labs  12/19/15 1239  WBC 14.8*  RBC 4.83  HCT 41.1  PLT 277    Recent Labs  12/19/15 1239 12/21/15 0333  NA 137 142  K 4.0 3.5  CL 105 107  CO2 22 27  BUN 12 20  CREATININE 0.63 0.64  GLUCOSE 104* 112*  CALCIUM 9.2 8.4*   No results for input(s): LABPT, INR in the last 72 hours.  EXAM General - Patient is Alert, Appropriate and Oriented Extremity - Neurologically intact Neurovascular intact Sensation intact distally Intact pulses distally No cellulitis present Compartment soft Dressing - dressing C/D/I Motor Function - intact, moving foot and toes well on exam.    History reviewed. No pertinent past medical history.  Assessment/Plan: 2 Days Post-Op Procedure(s) (LRB): IRRIGATION AND  DEBRIDEMENT WOUND (Right) Active Problems:   Flexor tenosynovitis of finger  Estimated body mass index is 27.27 kg/m as calculated from the following:   Height as of this encounter: 4\' 11"  (1.499 m).   Weight as of this encounter: 61.2 kg (135 lb). Discharge home with home health  Labs: Final cultures pending DVT Prophylaxis - None Will discharge patient to home on Augmentin 875 mg 1 tablet twice a day for 10 days Patient will need follow-up in clinic Friday this week. For wound check and dressing change. Patient will need to be out of work until seen at least prominent. Patient was instructed on wound care in the knee was given this dry.    Lynnda Shields. [facility] PA [facility] 12/21/2015, 7:48 AM

## 2015-12-21 NOTE — Care Management Important Message (Signed)
Important Message  Patient Details  Name: Teresa Dudley MRN: [id number] Date of Birth: [birth year]   Medicare Important Message Given:   (BCBS)    Adonis Huguenin, RN 12/21/2015, 8:31 AM

## 2015-12-21 NOTE — Care Management Note (Signed)
Case Management Note  Patient Details  Name: Teresa Dudley MRN: [id number] Date of Birth: 1960-05-23  Subjective/Objective:      Spoke with patient who is alert and oriented from home with family , Not home bound drives self and independent.              Action/Plan: No Needs patient will be discharged to home today. Signed off   Expected Discharge Date:                  Expected Discharge Plan:     In-House Referral:     Discharge planning Services  CM Consult  Post Acute Care Choice:    Choice offered to:     DME Arranged:    DME Agency:     HH Arranged:    HH Agency:     Status of Service:  Completed, signed off  If discussed at [employer] of Stay Meetings, dates discussed:    Additional Comments:  Adonis Huguenin, RN 12/21/2015, 8:27 AM

## 2015-12-21 NOTE — Progress Notes (Signed)
Pt discharged home dressing changed. Ortho is to call patient for follow up time Friday.

## 2015-12-21 NOTE — Progress Notes (Signed)
Report given to Matt

## 2015-12-21 NOTE — Anesthesia Postprocedure Evaluation (Signed)
Anesthesia Post Note  Patient: Teresa Dudley  Procedure(s) Performed: Procedure(s) (LRB): IRRIGATION AND DEBRIDEMENT WOUND (Right)  Patient location during evaluation: PACU Anesthesia Type: General Level of consciousness: awake and alert Pain management: pain level controlled Vital Signs Assessment: post-procedure vital signs reviewed and stable Respiratory status: spontaneous breathing, nonlabored ventilation, respiratory function stable and patient connected to nasal cannula oxygen Cardiovascular status: blood pressure returned to baseline and stable Postop Assessment: no signs of nausea or vomiting Anesthetic complications: no    Last Vitals:  Vitals:   12/21/15 0542 12/21/15 0806  BP: 108/62 (!) 116/48  Pulse: 81 64  Resp: 18   Temp: 36.7 C 36.7 C    Last Pain:  Vitals:   12/21/15 0944  TempSrc:   PainSc: 2                  Yevette Edwards

## 2015-12-21 NOTE — Progress Notes (Signed)
Occupational Therapy Treatment Patient Details Name: Teresa Dudley MRN: [id number] DOB: 1960-08-20 Today's Date: 12/21/2015    History of present illness Pt. is a 55 y.o. female who was admitted to [facility] for drainage, and debridement of a cat bite.   OT comments  Edema in the MPs of the 2nd through 5th digits have decreased, swelling in the 2nd, 3rd, and 5th PIPs, DIPS have improved. Pt. Continues to have edema in the 4th digit PIP, and DIP. Pt. continues to present with difficulty using her right hand to assist with ADL tasks. ROM is progressing, however pt. is unable to make a full composite fist with the 2nd through 5th digits. Pt. Does present with pain with ROM to the 4th digit MP, PIP, and DIP joints.  Pt. Participated in AROM/AAROM/PROM. Pt. Education was provided about elevation, and self-ROM. Pt. Pain is 5/10 with ROM. Pt. Could benefit from follow-up outpatient OT services/Hand Therapy to maximize ROM and right hand functioning.    F he right hand to engageollow Up Recommendations  Outpatient OT (Hand Therapy)    Equipment Recommendations       Recommendations for Other Services      Precautions / Restrictions Restrictions Weight Bearing Restrictions: No       Mobility Bed Mobility                  Transfers                      Balance                                   ADL Overall ADL's : Needs assistance/impaired Eating/Feeding: Set up   Grooming: Set up               Lower Body Dressing: Set up (With Left hand use.)   Toilet Transfer: Independent           Functional mobility during ADLs: Independent        Vision                     Perception     Praxis      Cognition   Behavior During Therapy: WFL for tasks assessed/performed Overall Cognitive Status: Within Functional Limits for tasks assessed                       Extremity/Trunk Assessment               Exercises      Shoulder Instructions       General Comments      Pertinent Vitals/ Pain          Home Living                                          Prior Functioning/Environment              Frequency Min 3X/week     Progress Toward Goals  OT Goals(current goals can now be found in the care plan section)     Acute Rehab OT Goals Patient Stated Goal: To regain the use of her right hand and fingers OT Goal Formulation: With patient Potential to Achieve Goals: Good  Plan      Co-evaluation  End of Session     Activity Tolerance Patient tolerated treatment well   Patient Left in bed;with call bell/phone within reach;with bed alarm set;with family/visitor present   Nurse Communication          Time: 0900-0930 OT Time Calculation (min): 30 min  Charges: OT Treatments $Self Care/Home Management : 23-37 mins   Olegario Messier, MS, OTR/L  Olegario Messier 12/21/2015, 9:33 AM

## 2015-12-22 LAB — AEROBIC/ANAEROBIC CULTURE (SURGICAL/DEEP WOUND)

## 2015-12-22 LAB — AEROBIC/ANAEROBIC CULTURE W GRAM STAIN (SURGICAL/DEEP WOUND)

## 2015-12-27 NOTE — Discharge Summary (Signed)
Physician Discharge Summary  Patient ID: Teresa Dudley Brunner MRN: [id number] DOB/AGE: 54/09/1960 54 y.o.  Admit date: 12/19/2015 Discharge date: 12/21/2015  Admission Diagnoses:  Flexor tenosynovitis of finger [M65.88] Cat bite, initial encounter [W55.01XA]   Discharge Diagnoses: Patient Active Problem List   Diagnosis Date Noted  . Flexor tenosynovitis of finger 12/19/2015    History reviewed. No pertinent past medical history.   Transfusion: No transfusions given doing this admission   Consultants (if any):  case management  Discharged Condition: Improved  Hospital Course: Teresa Dudley Sortino is an 55 y.o. female who was admitted 12/19/2015 with a diagnosis of right ring finger flexor tenosynovitis secondary to cat bite and went to the operating room on 12/19/2015 and underwent the above named procedures.    Surgeries:Procedure(s): IRRIGATION AND DEBRIDEMENT WOUND on 12/19/2015  Preoperative diagnosis:  Right ring finger flexor tenosynovitis                                         Postoperative diagnosis:  Same  Procedure: Debridement and Irrigation of right ring finger flexor tenosynovitis  Anesthesia: General  Estimated blood loss: minimal  Complications: none apparent  Specimens: Purulent drainage to microbiology X 2  Tourniquet time: 29 minutes Patient tolerated the surgery well. No complications .Patient was taken to PACU where she was stabilized and then transferred to the orthopedic floor.  Heels elevated off bed with rolled towels. No evidence of DVT. Calves non tender. Negative Homan. Physical therapy started on day #1 for gait training and transfer with OT starting on  day #1 for ADL and assisted devices. Patient has done well with therapy. Ambulated 200 feet upon being discharged.  Patient's IV , foley DC'd on day #1   She was given perioperative antibiotics:  Anti-infectives    Start     Dose/Rate Route Frequency Ordered Stop   12/21/15 1000   amoxicillin-clavulanate (AUGMENTIN) 875-125 MG per tablet 1 tablet  Status:  Discontinued     1 tablet Oral Every 12 hours 12/21/15 0756 12/21/15 1822   12/21/15 0000  amoxicillin-clavulanate (AUGMENTIN) 875-125 MG tablet     1 tablet Oral Every 12 hours 12/21/15 0758     12/20/15 0100  vancomycin (VANCOCIN) IVPB 750 mg/150 ml premix  Status:  Discontinued     750 mg 150 mL/hr over 60 Minutes Intravenous Every 12 hours 12/19/15 2048 12/21/15 1822   12/19/15 2100  Ampicillin-Sulbactam (UNASYN) 3 g in sodium chloride 0.9 % 100 mL IVPB  Status:  Discontinued     3 g 100 mL/hr over 60 Minutes Intravenous Every 6 hours 12/19/15 1711 12/21/15 1822   12/19/15 1715  vancomycin (VANCOCIN) IVPB 1000 mg/200 mL premix  Status:  Discontinued     1,000 mg 200 mL/hr over 60 Minutes Intravenous Every 12 hours 12/19/15 1711 12/19/15 2009   12/19/15 1445  Ampicillin-Sulbactam (UNASYN) 3 g in sodium chloride 0.9 % 100 mL IVPB     3 g 100 mL/hr over 60 Minutes Intravenous STAT 12/19/15 1422 12/19/15 1544   12/19/15 1430  vancomycin (VANCOCIN) IVPB 1000 mg/200 mL premix     1,000 mg 200 mL/hr over 60 Minutes Intravenous  Once 12/19/15 1422 12/19/15 1550    .  She was started on early ambulation, and fitted with TED stockings bilaterally for DVT prophylaxis.  She benefited maximally from the hospital stay and there were no complications.  Recent vital signs:  Vitals:   12/21/15 0542 12/21/15 0806  BP: 108/62 (!) 116/48  Pulse: 81 64  Resp: 18   Temp: 98.1 F (36.7 C) 98 F (36.7 C)    Recent laboratory studies:  Lab Results  Component Value Date   HGB 13.9 12/19/2015   Lab Results  Component Value Date   WBC 14.8 (Dudley) 12/19/2015   PLT 277 12/19/2015   No results found for: INR Lab Results  Component Value Date   NA 142 12/21/2015   K 3.5 12/21/2015   CL 107 12/21/2015   CO2 27 12/21/2015   BUN 20 12/21/2015   CREATININE 0.64 12/21/2015   GLUCOSE 112 (Dudley) 12/21/2015    Discharge  Medications:     Medication List    TAKE these medications   amoxicillin-clavulanate 875-125 MG tablet Commonly known as:  AUGMENTIN Take 1 tablet by mouth every 12 (twelve) hours.   HYDROcodone-acetaminophen 5-325 MG tablet Commonly known as:  NORCO/VICODIN Take 1-2 tablets by mouth every 4 (four) hours as needed for moderate pain.   naproxen 500 MG tablet Commonly known as:  NAPROSYN Take 500 mg by mouth every 12 (twelve) hours as needed.   traZODone 100 MG tablet Commonly known as:  DESYREL Take 100 mg by mouth at bedtime.       Diagnostic Studies: Dg Hand Complete Right  Result Date: 12/19/2015 CLINICAL DATA:  Cat bite yesterday in the fourth digit, initial encounter EXAM: RIGHT HAND - COMPLETE 3+ VIEW COMPARISON:  None. FINDINGS: Swelling of the fourth digit is noted consistent with the given clinical history. Densities are noted along the medial skin surface of uncertain significance. No acute fracture is noted. No other focal abnormality is seen. IMPRESSION: Soft tissue swelling without acute bony abnormality. Electronically Signed   By: Alcide Clever M.D.   On: 12/19/2015 14:45   Disposition: 01-Home or Self Care  Discharge Instructions    Diet - low sodium heart healthy    Complete by:  As directed   Diet - low sodium heart healthy    Complete by:  As directed   Increase activity slowly    Complete by:  As directed   Increase activity slowly    Complete by:  As directed         Signed: Khalid Lacko R. 12/27/2015, 7:41 AM

## 2015-12-28 LAB — AEROBIC/ANAEROBIC CULTURE W GRAM STAIN (SURGICAL/DEEP WOUND)

## 2015-12-28 LAB — AEROBIC/ANAEROBIC CULTURE (SURGICAL/DEEP WOUND)

## 2016-07-05 ENCOUNTER — Other Ambulatory Visit: Payer: Self-pay | Admitting: Family Medicine

## 2016-07-05 DIAGNOSIS — Z1231 Encounter for screening mammogram for malignant neoplasm of breast: Secondary | ICD-10-CM

## 2016-07-28 ENCOUNTER — Ambulatory Visit
Admission: RE | Admit: 2016-07-28 | Discharge: 2016-07-28 | Disposition: A | Discharge status: Home / Self Care | Payer: BLUE CROSS/BLUE SHIELD | Source: Ambulatory Visit | Attending: Family Medicine | Admitting: Family Medicine

## 2016-07-28 DIAGNOSIS — Z1231 Encounter for screening mammogram for malignant neoplasm of breast: Secondary | ICD-10-CM | POA: Diagnosis not present

## 2016-11-24 ENCOUNTER — Other Ambulatory Visit: Payer: Self-pay | Admitting: Internal Medicine

## 2016-11-24 DIAGNOSIS — M545 Low back pain, unspecified: Secondary | ICD-10-CM

## 2016-11-24 DIAGNOSIS — M5440 Lumbago with sciatica, unspecified side: Secondary | ICD-10-CM

## 2016-11-24 DIAGNOSIS — M519 Unspecified thoracic, thoracolumbar and lumbosacral intervertebral disc disorder: Secondary | ICD-10-CM

## 2016-11-27 ENCOUNTER — Other Ambulatory Visit: Payer: Self-pay | Admitting: Internal Medicine

## 2016-11-27 DIAGNOSIS — M519 Unspecified thoracic, thoracolumbar and lumbosacral intervertebral disc disorder: Secondary | ICD-10-CM

## 2016-11-27 DIAGNOSIS — M5440 Lumbago with sciatica, unspecified side: Secondary | ICD-10-CM

## 2016-11-27 DIAGNOSIS — M545 Low back pain, unspecified: Secondary | ICD-10-CM

## 2016-11-29 ENCOUNTER — Ambulatory Visit
Admission: RE | Admit: 2016-11-29 | Discharge: 2016-11-29 | Disposition: A | Discharge status: Home / Self Care | Payer: BLUE CROSS/BLUE SHIELD | Source: Ambulatory Visit | Attending: Internal Medicine | Admitting: Internal Medicine

## 2016-11-29 DIAGNOSIS — M5440 Lumbago with sciatica, unspecified side: Secondary | ICD-10-CM

## 2016-11-29 DIAGNOSIS — M519 Unspecified thoracic, thoracolumbar and lumbosacral intervertebral disc disorder: Secondary | ICD-10-CM

## 2016-11-29 DIAGNOSIS — M545 Low back pain, unspecified: Secondary | ICD-10-CM

## 2016-12-08 ENCOUNTER — Other Ambulatory Visit: Payer: Self-pay | Admitting: Neurological Surgery

## 2016-12-08 DIAGNOSIS — M5432 Sciatica, left side: Secondary | ICD-10-CM

## 2016-12-08 DIAGNOSIS — M4316 Spondylolisthesis, lumbar region: Secondary | ICD-10-CM

## 2016-12-08 DIAGNOSIS — M5416 Radiculopathy, lumbar region: Secondary | ICD-10-CM

## 2016-12-08 DIAGNOSIS — R200 Anesthesia of skin: Secondary | ICD-10-CM

## 2016-12-13 ENCOUNTER — Other Ambulatory Visit: Payer: Self-pay | Admitting: Neurological Surgery

## 2016-12-13 DIAGNOSIS — M4316 Spondylolisthesis, lumbar region: Secondary | ICD-10-CM

## 2016-12-13 DIAGNOSIS — M5432 Sciatica, left side: Secondary | ICD-10-CM

## 2016-12-13 DIAGNOSIS — M5416 Radiculopathy, lumbar region: Secondary | ICD-10-CM

## 2016-12-14 ENCOUNTER — Other Ambulatory Visit: Payer: Self-pay | Admitting: Neurological Surgery

## 2016-12-14 DIAGNOSIS — M5432 Sciatica, left side: Secondary | ICD-10-CM

## 2016-12-14 DIAGNOSIS — M5416 Radiculopathy, lumbar region: Secondary | ICD-10-CM

## 2016-12-14 DIAGNOSIS — M4316 Spondylolisthesis, lumbar region: Secondary | ICD-10-CM

## 2016-12-20 ENCOUNTER — Ambulatory Visit: Payer: BLUE CROSS/BLUE SHIELD

## 2016-12-20 ENCOUNTER — Other Ambulatory Visit: Payer: BLUE CROSS/BLUE SHIELD

## 2016-12-25 ENCOUNTER — Ambulatory Visit
Admission: RE | Admit: 2016-12-25 | Discharge: 2016-12-25 | Disposition: A | Discharge status: Home / Self Care | Payer: BLUE CROSS/BLUE SHIELD | Source: Ambulatory Visit | Attending: Neurological Surgery | Admitting: Neurological Surgery

## 2016-12-25 DIAGNOSIS — M5432 Sciatica, left side: Secondary | ICD-10-CM

## 2016-12-25 DIAGNOSIS — M5416 Radiculopathy, lumbar region: Secondary | ICD-10-CM

## 2016-12-25 DIAGNOSIS — M4316 Spondylolisthesis, lumbar region: Secondary | ICD-10-CM

## 2017-02-12 ENCOUNTER — Ambulatory Visit
Admission: RE | Admit: 2017-02-12 | Discharge: 2017-02-12 | Disposition: A | Discharge status: Home / Self Care | Payer: BLUE CROSS/BLUE SHIELD | Source: Ambulatory Visit | Attending: Neurological Surgery | Admitting: Neurological Surgery

## 2017-02-12 ENCOUNTER — Encounter
Admission: RE | Admit: 2017-02-12 | Discharge: 2017-02-12 | Disposition: A | Discharge status: Home / Self Care | Payer: BLUE CROSS/BLUE SHIELD | Source: Ambulatory Visit | Attending: Neurological Surgery | Admitting: Neurological Surgery

## 2017-02-12 ENCOUNTER — Encounter: Payer: Self-pay | Admitting: *Deleted

## 2017-02-12 DIAGNOSIS — M659 Synovitis and tenosynovitis, unspecified: Secondary | ICD-10-CM | POA: Insufficient documentation

## 2017-02-12 DIAGNOSIS — F419 Anxiety disorder, unspecified: Secondary | ICD-10-CM | POA: Diagnosis not present

## 2017-02-12 DIAGNOSIS — M5416 Radiculopathy, lumbar region: Secondary | ICD-10-CM | POA: Diagnosis not present

## 2017-02-12 DIAGNOSIS — Z01812 Encounter for preprocedural laboratory examination: Secondary | ICD-10-CM | POA: Insufficient documentation

## 2017-02-12 DIAGNOSIS — Z01810 Encounter for preprocedural cardiovascular examination: Secondary | ICD-10-CM | POA: Diagnosis present

## 2017-02-12 DIAGNOSIS — F329 Major depressive disorder, single episode, unspecified: Secondary | ICD-10-CM | POA: Insufficient documentation

## 2017-02-12 DIAGNOSIS — I498 Other specified cardiac arrhythmias: Secondary | ICD-10-CM | POA: Diagnosis not present

## 2017-02-12 DIAGNOSIS — Z01818 Encounter for other preprocedural examination: Secondary | ICD-10-CM | POA: Diagnosis present

## 2017-02-12 DIAGNOSIS — M4316 Spondylolisthesis, lumbar region: Secondary | ICD-10-CM | POA: Insufficient documentation

## 2017-02-12 HISTORY — DX: Unspecified osteoarthritis, unspecified site: M19.90

## 2017-02-12 HISTORY — DX: Anxiety disorder, unspecified: F41.9

## 2017-02-12 LAB — URINALYSIS, COMPLETE (UACMP) WITH MICROSCOPIC
Bacteria, UA: NONE SEEN
Bilirubin Urine: NEGATIVE
Glucose, UA: NEGATIVE mg/dL
Ketones, ur: NEGATIVE mg/dL
Leukocytes, UA: NEGATIVE
Nitrite: NEGATIVE
Protein, ur: NEGATIVE mg/dL
Specific Gravity, Urine: 1.02 (ref 1.005–1.030)
pH: 5 (ref 5.0–8.0)

## 2017-02-12 LAB — CBC
HCT: 40.6 % (ref 35.0–47.0)
Hemoglobin: 13.8 g/dL (ref 12.0–16.0)
MCH: 29.4 pg (ref 26.0–34.0)
MCHC: 33.9 g/dL (ref 32.0–36.0)
MCV: 86.8 fL (ref 80.0–100.0)
Platelets: 250 10*3/uL (ref 150–440)
RBC: 4.68 MIL/uL (ref 3.80–5.20)
RDW: 13.4 % (ref 11.5–14.5)
WBC: 5.9 10*3/uL (ref 3.6–11.0)

## 2017-02-12 LAB — BASIC METABOLIC PANEL
Anion gap: 9 (ref 5–15)
BUN: 13 mg/dL (ref 6–20)
CO2: 24 mmol/L (ref 22–32)
Calcium: 9.2 mg/dL (ref 8.9–10.3)
Chloride: 106 mmol/L (ref 101–111)
Creatinine, Ser: 0.63 mg/dL (ref 0.44–1.00)
GFR calc Af Amer: >60 mL/min (ref >60)
GLUCOSE: 100 mg/dL — AB (ref 65–99)
POTASSIUM: 4 mmol/L (ref 3.5–5.1)
Sodium: 139 mmol/L (ref 135–145)

## 2017-02-12 LAB — PROTIME-INR
INR: 0.89
Prothrombin Time: 12 s (ref 11.4–15.2)

## 2017-02-12 LAB — SURGICAL PCR SCREEN
MRSA, PCR: NEGATIVE
Staphylococcus aureus: NEGATIVE

## 2017-02-12 LAB — APTT: aPTT: 30 s (ref 24–36)

## 2017-02-12 NOTE — Patient Instructions (Signed)
Your procedure is scheduled on: February 19, 2017 Report to Same Day Surgery on the 2nd floor in the [facility]. To find out your arrival time, please call [phone] between 1PM - 3PM on: Friday February 16, 2017  REMEMBER: Instructions that are not followed completely may result in serious medical risk up to and including death; or upon the discretion of your surgeon and anesthesiologist your surgery may need to be rescheduled.  Do not eat food or drink liquids after midnight. No gum chewing or hard candies.  You may however, drink CLEAR liquids up to 2 hours before you are scheduled to arrive at the hospital for your procedure.  Do not drink clear liquids within 2 hours of your scheduled arrival to the hospital as this may lead to your procedure being delayed or rescheduled.  Clear liquids include: - water  - apple juice without pulp - clear gatorade - black coffee or tea (NO milk, creamers, sugars) DO NOT drink anything not on this list.  No Alcohol for 24 hours before or after surgery.  No Smoking for 24 hours prior to surgery.  Notify your doctor if there is any change in your medical condition (cold, fever, infection).  Do not wear jewelry, make-up, hairpins, clips or nail polish.  Do not wear lotions, powders, or perfumes OR  DEODRANT. Do not shave 48 hours prior to surgery. Men may shave face and neck.  Contacts and dentures may not be worn into surgery.  Do not bring valuables to the hospital. [facility] is not responsible for any belongings or valuables.   TAKE THESE MEDICATIONS THE MORNING OF SURGERY WITH A SIP OF WATER: NONE   Use CHG Soap or wipes as directed on instruction sheet.  Stop Anti-inflammatories such as Advil, Aleve, Ibuprofen, Motrin, Naproxen, Naprosyn, Goodie powder, or aspirin products. (May take Tylenol or Acetaminophen and Celebrex if needed.)  Stop supplements until after surgery. (May continue Vitamin D, Vitamin B, and  multivitamin.)  If you are being admitted to the hospital overnight, leave your suitcase in the car. After surgery it may be brought to your room.  If you are being discharged the day of surgery, you will not be allowed to drive home. You will need someone to drive you home and stay with you that night.   If you are taking public transportation, you will need to have a responsible adult to with you.  Please call the number above if you have any questions about these instructions.

## 2017-02-19 ENCOUNTER — Ambulatory Visit
Admission: RE | Admit: 2017-02-19 | Discharge: 2017-02-19 | Disposition: A | Discharge status: Home / Self Care | Payer: BLUE CROSS/BLUE SHIELD | Source: Ambulatory Visit | Attending: Neurological Surgery | Admitting: Neurological Surgery

## 2017-02-19 ENCOUNTER — Encounter: Payer: Self-pay | Admitting: Emergency Medicine

## 2017-02-19 DIAGNOSIS — Z538 Procedure and treatment not carried out for other reasons: Secondary | ICD-10-CM | POA: Diagnosis not present

## 2017-02-19 DIAGNOSIS — M4316 Spondylolisthesis, lumbar region: Secondary | ICD-10-CM | POA: Insufficient documentation

## 2017-02-19 MED ORDER — HYDROMORPHONE HCL 1 MG/ML IJ SOLN
INTRAMUSCULAR | Status: AC
  Filled 2017-02-19: qty 1

## 2017-02-19 MED ORDER — EPHEDRINE SULFATE 50 MG/ML IJ SOLN
INTRAMUSCULAR | Status: AC
  Filled 2017-02-19: qty 1

## 2017-02-19 MED ORDER — BUPIVACAINE-EPINEPHRINE (PF) 0.25% -1:200000 IJ SOLN
INTRAMUSCULAR | Status: AC
  Filled 2017-02-19: qty 30

## 2017-02-19 MED ORDER — BACITRACIN 50000 UNITS IM SOLR
INTRAMUSCULAR | Status: AC
  Filled 2017-02-19: qty 1

## 2017-02-19 MED ORDER — GLYCOPYRROLATE 0.2 MG/ML IJ SOLN
INTRAMUSCULAR | Status: AC
  Filled 2017-02-19: qty 1

## 2017-02-19 MED ORDER — PROPOFOL 10 MG/ML IV BOLUS
INTRAVENOUS | Status: AC
  Filled 2017-02-19: qty 20

## 2017-02-19 MED ORDER — GENTAMICIN SULFATE 40 MG/ML IJ SOLN
270.0000 mg | INTRAMUSCULAR | Status: AC
  Filled 2017-02-19: qty 6.75

## 2017-02-19 MED ORDER — VANCOMYCIN HCL IN DEXTROSE 1-5 GM/200ML-% IV SOLN: 1000.0000 mg | Freq: Once | INTRAVENOUS | Status: DC

## 2017-02-19 MED ORDER — VANCOMYCIN HCL IN DEXTROSE 1-5 GM/200ML-% IV SOLN
INTRAVENOUS | Status: AC
  Filled 2017-02-19: qty 200

## 2017-02-19 MED ORDER — REMIFENTANIL HCL 1 MG IV SOLR
INTRAVENOUS | Status: AC
  Filled 2017-02-19: qty 2000

## 2017-02-19 MED ORDER — PROPOFOL 500 MG/50ML IV EMUL
INTRAVENOUS | Status: AC
  Filled 2017-02-19: qty 50

## 2017-02-19 MED ORDER — FAMOTIDINE 20 MG PO TABS
20.0000 mg | ORAL_TABLET | Freq: Once | ORAL | Status: AC
  Administered 2017-02-19: 20 mg via ORAL

## 2017-02-19 MED ORDER — FAMOTIDINE 20 MG PO TABS
ORAL_TABLET | ORAL | Status: AC
  Filled 2017-02-19: qty 1

## 2017-02-19 MED ORDER — DEXAMETHASONE SODIUM PHOSPHATE 10 MG/ML IJ SOLN
INTRAMUSCULAR | Status: AC
  Filled 2017-02-19: qty 1

## 2017-02-19 MED ORDER — ONDANSETRON HCL 4 MG/2ML IJ SOLN
INTRAMUSCULAR | Status: AC
  Filled 2017-02-19: qty 2

## 2017-02-19 MED ORDER — MIDAZOLAM HCL 2 MG/2ML IJ SOLN
INTRAMUSCULAR | Status: AC
  Filled 2017-02-19: qty 2

## 2017-02-19 MED ORDER — METHYLPREDNISOLONE ACETATE 40 MG/ML IJ SUSP
INTRAMUSCULAR | Status: AC
  Filled 2017-02-19: qty 1

## 2017-02-19 MED ORDER — LACTATED RINGERS IV SOLN
INTRAVENOUS | Status: DC
  Administered 2017-02-19: 09:00:00 via INTRAVENOUS

## 2017-02-19 NOTE — OR Nursing (Signed)
Patient presented this morning with a fresh laceration to the right forearm but a dog. Dr Teola Bradley rescheduled for 03/05/17

## 2017-02-19 NOTE — Progress Notes (Signed)
Pharmacy consult for Antibiotic dosing for surgical prophylaxis:  56 yo F Wt= 68 kg Ht 52ft11in,  Scr0.63,  crcl 67.4 ml/min.  Will order Vancomycin  IV x 1 for surgical prophylaxis.  Will order Gentamicin 5 mg/kg (270 mg) based on adj body weight of 54 kg.   Bari Mantis PharmD Clinical Pharmacist 02/19/2017

## 2017-02-19 NOTE — H&P (Signed)
Case cancelled due to right arm with open sore that occurred this AM. Appears with erythema. I inspected the wound and does penetrate past the dermis. With plan for new hardware implantation and new wound/sore that could develop a cellulitis and affect the hardware placed, will delay surgery 2 weeks and place on 10 course of bactrim. Family updated and all parties were in agreement that this was safest course of action.   Noralee Stain, MD

## 2017-02-19 NOTE — Anesthesia Preprocedure Evaluation (Addendum)
Anesthesia Evaluation  Patient identified by MRN, date of birth, ID band Patient awake    Reviewed: Allergy & Precautions, H&P , NPO status , Patient's Chart, lab work & pertinent test results, reviewed documented beta blocker date and time   History of Anesthesia Complications Negative for: history of anesthetic complications  Airway Mallampati: III  TM Distance: >3 FB Neck ROM: full    Dental  (+) Poor Dentition, Chipped   Pulmonary neg pulmonary ROS, neg shortness of breath, former smoker,    Pulmonary exam normal        Cardiovascular Exercise Tolerance: Good (-) angina(-) Past MI and (-) DOE negative cardio ROS Normal cardiovascular exam Rhythm:regular Rate:Normal     Neuro/Psych negative neurological ROS  negative psych ROS   GI/Hepatic negative GI ROS, Neg liver ROS, neg GERD  ,  Endo/Other  negative endocrine ROS  Renal/GU negative Renal ROS  negative genitourinary   Musculoskeletal  (+) Arthritis ,   Abdominal   Peds  Hematology negative hematology ROS (+)   Anesthesia Other Findings Past Medical History: No date: Anxiety No date: Arthritis Past Surgical History: No date: CHOLECYSTECTOMY 12/19/2015: INCISION AND DRAINAGE OF WOUND; Right     Comment:  Procedure: IRRIGATION AND DEBRIDEMENT WOUND;  Surgeon:               Juliane Poot, MD;  Location: [facility] ORS;  Service:               Orthopedics;  Laterality: Right; No date: TUBAL LIGATION BMI    Body Mass Index:  30.30 kg/m     Reproductive/Obstetrics negative OB ROS                            Anesthesia Physical Anesthesia Plan  ASA: II  Anesthesia Plan: General ETT   Post-op Pain Management:    Induction: Intravenous  PONV Risk Score and Plan: 4 or greater and Ondansetron, Dexamethasone, Midazolam and Propofol infusion  Airway Management Planned: Oral ETT  Additional Equipment:   Intra-op Plan:    Post-operative Plan: Extubation in OR  Informed Consent: I have reviewed the patients History and Physical, chart, labs and discussed the procedure including the risks, benefits and alternatives for the proposed anesthesia with the patient or authorized representative who has indicated his/her understanding and acceptance.   Dental Advisory Given  Plan Discussed with: CRNA  Anesthesia Plan Comments: (Patient consented for risks of anesthesia including but not limited to:  - adverse reactions to medications - damage to teeth, lips or other oral mucosa - sore throat or hoarseness - Damage to heart, brain, lungs or loss of life  Patient voiced understanding.)       Anesthesia Quick Evaluation

## 2017-03-05 ENCOUNTER — Inpatient Hospital Stay: Payer: BLUE CROSS/BLUE SHIELD

## 2017-03-05 ENCOUNTER — Inpatient Hospital Stay: Payer: BLUE CROSS/BLUE SHIELD | Admitting: Certified Registered Nurse Anesthetist

## 2017-03-05 ENCOUNTER — Inpatient Hospital Stay
Admission: RE | Admit: 2017-03-05 | Discharge: 2017-03-07 | DRG: 460 | Disposition: A | Discharge status: Home / Self Care | Payer: BLUE CROSS/BLUE SHIELD | Source: Ambulatory Visit | Attending: Neurological Surgery | Admitting: Neurological Surgery

## 2017-03-05 ENCOUNTER — Encounter
Admission: RE | Disposition: A | Discharge status: Home / Self Care | Payer: Self-pay | Source: Ambulatory Visit | Attending: Neurological Surgery

## 2017-03-05 ENCOUNTER — Encounter: Payer: Self-pay | Admitting: *Deleted

## 2017-03-05 DIAGNOSIS — Z981 Arthrodesis status: Secondary | ICD-10-CM

## 2017-03-05 DIAGNOSIS — M4802 Spinal stenosis, cervical region: Secondary | ICD-10-CM | POA: Diagnosis present

## 2017-03-05 DIAGNOSIS — M48061 Spinal stenosis, lumbar region without neurogenic claudication: Secondary | ICD-10-CM | POA: Diagnosis present

## 2017-03-05 DIAGNOSIS — M5116 Intervertebral disc disorders with radiculopathy, lumbar region: Secondary | ICD-10-CM | POA: Diagnosis present

## 2017-03-05 DIAGNOSIS — M4805 Spinal stenosis, thoracolumbar region: Secondary | ICD-10-CM | POA: Diagnosis present

## 2017-03-05 DIAGNOSIS — Z803 Family history of malignant neoplasm of breast: Secondary | ICD-10-CM | POA: Diagnosis not present

## 2017-03-05 DIAGNOSIS — Z87891 Personal history of nicotine dependence: Secondary | ICD-10-CM

## 2017-03-05 DIAGNOSIS — M4316 Spondylolisthesis, lumbar region: Secondary | ICD-10-CM | POA: Diagnosis present

## 2017-03-05 DIAGNOSIS — G8929 Other chronic pain: Secondary | ICD-10-CM | POA: Diagnosis present

## 2017-03-05 DIAGNOSIS — E7849 Other hyperlipidemia: Secondary | ICD-10-CM | POA: Diagnosis present

## 2017-03-05 DIAGNOSIS — Z801 Family history of malignant neoplasm of trachea, bronchus and lung: Secondary | ICD-10-CM | POA: Diagnosis not present

## 2017-03-05 DIAGNOSIS — Z419 Encounter for procedure for purposes other than remedying health state, unspecified: Secondary | ICD-10-CM

## 2017-03-05 DIAGNOSIS — M5416 Radiculopathy, lumbar region: Secondary | ICD-10-CM | POA: Diagnosis present

## 2017-03-05 HISTORY — PX: TRANSFORAMINAL LUMBAR INTERBODY FUSION W/ MIS 1 LEVEL: SHX6145

## 2017-03-05 HISTORY — DX: Failed or difficult intubation, initial encounter: T88.4XXA

## 2017-03-05 LAB — CBC
HEMATOCRIT: 34.7 % — AB (ref 35.0–47.0)
Hemoglobin: 11.6 g/dL — ABNORMAL LOW (ref 12.0–16.0)
MCH: 29.4 pg (ref 26.0–34.0)
MCHC: 33.4 g/dL (ref 32.0–36.0)
MCV: 88 fL (ref 80.0–100.0)
PLATELETS: 230 10*3/uL (ref 150–440)
RBC: 3.95 MIL/uL (ref 3.80–5.20)
RDW: 13.8 % (ref 11.5–14.5)
WBC: 13.7 10*3/uL — AB (ref 3.6–11.0)

## 2017-03-05 LAB — TYPE AND SCREEN
ABO/RH(D): O POS
ANTIBODY SCREEN: NEGATIVE

## 2017-03-05 LAB — BASIC METABOLIC PANEL
ANION GAP: 13 (ref 5–15)
BUN: 12 mg/dL (ref 6–20)
CO2: 23 mmol/L (ref 22–32)
Calcium: 8.5 mg/dL — ABNORMAL LOW (ref 8.9–10.3)
Chloride: 102 mmol/L (ref 101–111)
Creatinine, Ser: 0.86 mg/dL (ref 0.44–1.00)
GLUCOSE: 194 mg/dL — AB (ref 65–99)
POTASSIUM: 4.1 mmol/L (ref 3.5–5.1)
Sodium: 138 mmol/L (ref 135–145)

## 2017-03-05 SURGERY — MINIMALLY INVASIVE (MIS) TRANSFORAMINAL LUMBAR INTERBODY FUSION (TLIF) 1 LEVEL
Anesthesia: General | Laterality: Left

## 2017-03-05 MED ORDER — SODIUM CHLORIDE 0.9% FLUSH: 9.0000 mL | INTRAVENOUS | Status: DC | PRN

## 2017-03-05 MED ORDER — SODIUM CHLORIDE FLUSH 0.9 % IV SOLN
INTRAVENOUS | Status: AC
  Filled 2017-03-05: qty 10

## 2017-03-05 MED ORDER — BUPIVACAINE-EPINEPHRINE (PF) 0.25% -1:200000 IJ SOLN
INTRAMUSCULAR | Status: AC
  Filled 2017-03-05: qty 30

## 2017-03-05 MED ORDER — VANCOMYCIN HCL 1000 MG IV SOLR
INTRAVENOUS | Status: DC | PRN
  Administered 2017-03-05: 1000 mg via TOPICAL

## 2017-03-05 MED ORDER — LIDOCAINE HCL (PF) 2 % IJ SOLN
INTRAMUSCULAR | Status: AC
  Filled 2017-03-05: qty 10

## 2017-03-05 MED ORDER — DIPHENHYDRAMINE HCL 50 MG/ML IJ SOLN: 12.5000 mg | Freq: Four times a day (QID) | INTRAMUSCULAR | Status: DC | PRN

## 2017-03-05 MED ORDER — FENTANYL CITRATE (PF) 100 MCG/2ML IJ SOLN
INTRAMUSCULAR | Status: AC
  Filled 2017-03-05: qty 2

## 2017-03-05 MED ORDER — MIDAZOLAM HCL 2 MG/2ML IJ SOLN
INTRAMUSCULAR | Status: AC
  Filled 2017-03-05: qty 2

## 2017-03-05 MED ORDER — NALOXONE HCL 0.4 MG/ML IJ SOLN: 0.4000 mg | INTRAMUSCULAR | Status: DC | PRN

## 2017-03-05 MED ORDER — MIDAZOLAM HCL 2 MG/2ML IJ SOLN
INTRAMUSCULAR | Status: DC | PRN
  Administered 2017-03-05: 2 mg via INTRAVENOUS

## 2017-03-05 MED ORDER — OXYCODONE HCL 5 MG PO TABS
5.0000 mg | ORAL_TABLET | ORAL | Status: DC | PRN
  Administered 2017-03-05 (×2): 5 mg via ORAL
  Administered 2017-03-06 – 2017-03-07 (×8): 10 mg via ORAL
  Filled 2017-03-05 (×8): qty 2
  Filled 2017-03-05 (×2): qty 1

## 2017-03-05 MED ORDER — SODIUM CHLORIDE 0.9 % IV SOLN
INTRAVENOUS | Status: DC
  Administered 2017-03-06: 75 mL/h via INTRAVENOUS

## 2017-03-05 MED ORDER — TRAZODONE HCL 100 MG PO TABS
100.0000 mg | ORAL_TABLET | Freq: Every day | ORAL | Status: DC
  Administered 2017-03-05 – 2017-03-06 (×2): 100 mg via ORAL
  Filled 2017-03-05 (×2): qty 1

## 2017-03-05 MED ORDER — SODIUM CHLORIDE 0.9 % IJ SOLN
INTRAMUSCULAR | Status: AC
  Filled 2017-03-05: qty 10

## 2017-03-05 MED ORDER — SODIUM CHLORIDE 0.9 % IV SOLN: 250.0000 mL | INTRAVENOUS | Status: DC

## 2017-03-05 MED ORDER — LACTATED RINGERS IV SOLN
INTRAVENOUS | Status: DC
  Administered 2017-03-05: 07:00:00 via INTRAVENOUS

## 2017-03-05 MED ORDER — VANCOMYCIN HCL IN DEXTROSE 1-5 GM/200ML-% IV SOLN
1000.0000 mg | Freq: Two times a day (BID) | INTRAVENOUS | Status: DC
  Filled 2017-03-05 (×2): qty 200

## 2017-03-05 MED ORDER — ONDANSETRON HCL 4 MG/2ML IJ SOLN
INTRAMUSCULAR | Status: AC
  Filled 2017-03-05: qty 2

## 2017-03-05 MED ORDER — FENTANYL CITRATE (PF) 100 MCG/2ML IJ SOLN
INTRAMUSCULAR | Status: AC
  Administered 2017-03-05: 50 ug via INTRAVENOUS
  Filled 2017-03-05: qty 2

## 2017-03-05 MED ORDER — DEXAMETHASONE SODIUM PHOSPHATE 10 MG/ML IJ SOLN
INTRAMUSCULAR | Status: DC | PRN
  Administered 2017-03-05: 10 mg via INTRAVENOUS

## 2017-03-05 MED ORDER — REMIFENTANIL HCL 2 MG IV SOLR
INTRAVENOUS | Status: DC | PRN
  Administered 2017-03-05: .15 ug/kg/min via INTRAVENOUS

## 2017-03-05 MED ORDER — ROCURONIUM BROMIDE 100 MG/10ML IV SOLN
INTRAVENOUS | Status: DC | PRN
  Administered 2017-03-05: 40 mg via INTRAVENOUS
  Administered 2017-03-05: 10 mg via INTRAVENOUS

## 2017-03-05 MED ORDER — HYDROMORPHONE 1 MG/ML IV SOLN
INTRAVENOUS | Status: DC
  Administered 2017-03-05: 0 mg via INTRAVENOUS
  Administered 2017-03-05: 25 mg via INTRAVENOUS
  Administered 2017-03-06: 0.6 mg via INTRAVENOUS
  Administered 2017-03-06: 1.5 mg via INTRAVENOUS
  Filled 2017-03-05: qty 25

## 2017-03-05 MED ORDER — PHENYLEPHRINE HCL 10 MG/ML IJ SOLN
INTRAMUSCULAR | Status: DC | PRN
  Administered 2017-03-05: 20 ug/min via INTRAVENOUS

## 2017-03-05 MED ORDER — SODIUM CHLORIDE 0.9% FLUSH
3.0000 mL | Freq: Two times a day (BID) | INTRAVENOUS | Status: DC
  Administered 2017-03-05 – 2017-03-07 (×3): 3 mL via INTRAVENOUS

## 2017-03-05 MED ORDER — METHOCARBAMOL 500 MG PO TABS
750.0000 mg | ORAL_TABLET | Freq: Four times a day (QID) | ORAL | Status: DC | PRN
Start: 2017-03-05 — End: 2017-03-05

## 2017-03-05 MED ORDER — HYDROMORPHONE HCL 1 MG/ML IJ SOLN
0.2500 mg | INTRAMUSCULAR | Status: DC | PRN
  Administered 2017-03-05 (×4): 0.5 mg via INTRAVENOUS

## 2017-03-05 MED ORDER — FAMOTIDINE 20 MG PO TABS
20.0000 mg | ORAL_TABLET | Freq: Once | ORAL | Status: AC
  Administered 2017-03-05: 20 mg via ORAL

## 2017-03-05 MED ORDER — ONDANSETRON HCL 4 MG/2ML IJ SOLN: 4.0000 mg | Freq: Four times a day (QID) | INTRAMUSCULAR | Status: DC | PRN

## 2017-03-05 MED ORDER — EPHEDRINE SULFATE 50 MG/ML IJ SOLN
INTRAMUSCULAR | Status: AC
  Filled 2017-03-05: qty 1

## 2017-03-05 MED ORDER — ZINC SULFATE 220 (50 ZN) MG PO CAPS
220.0000 mg | ORAL_CAPSULE | Freq: Every day | ORAL | Status: DC
  Administered 2017-03-05 – 2017-03-07 (×3): 220 mg via ORAL
  Filled 2017-03-05 (×3): qty 1

## 2017-03-05 MED ORDER — LIDOCAINE HCL (CARDIAC) 20 MG/ML IV SOLN
INTRAVENOUS | Status: DC | PRN
  Administered 2017-03-05: 50 mg via INTRAVENOUS

## 2017-03-05 MED ORDER — PROPOFOL 10 MG/ML IV BOLUS
INTRAVENOUS | Status: AC
  Filled 2017-03-05: qty 20

## 2017-03-05 MED ORDER — PHENYLEPHRINE HCL 10 MG/ML IJ SOLN
INTRAMUSCULAR | Status: AC
  Filled 2017-03-05: qty 1

## 2017-03-05 MED ORDER — FENTANYL CITRATE (PF) 100 MCG/2ML IJ SOLN: 25.0000 ug | INTRAMUSCULAR | Status: DC | PRN

## 2017-03-05 MED ORDER — ONDANSETRON HCL 4 MG PO TABS
4.0000 mg | ORAL_TABLET | Freq: Four times a day (QID) | ORAL | Status: DC | PRN
Start: 2017-03-05 — End: 2017-03-07

## 2017-03-05 MED ORDER — FENTANYL CITRATE (PF) 100 MCG/2ML IJ SOLN
25.0000 ug | INTRAMUSCULAR | Status: DC | PRN
  Administered 2017-03-05 (×3): 50 ug via INTRAVENOUS

## 2017-03-05 MED ORDER — OXYCODONE HCL 5 MG PO TABS
5.0000 mg | ORAL_TABLET | Freq: Once | ORAL | Status: DC | PRN
Start: 2017-03-05 — End: 2017-03-05

## 2017-03-05 MED ORDER — GENTAMICIN IN SALINE 1.6-0.9 MG/ML-% IV SOLN
80.0000 mg | Freq: Once | INTRAVENOUS | Status: AC
  Administered 2017-03-05: 80 mg via INTRAVENOUS
  Filled 2017-03-05: qty 50

## 2017-03-05 MED ORDER — SUGAMMADEX SODIUM 200 MG/2ML IV SOLN
INTRAVENOUS | Status: DC | PRN
  Administered 2017-03-05: 140 mg via INTRAVENOUS

## 2017-03-05 MED ORDER — BUPIVACAINE-EPINEPHRINE (PF) 0.25% -1:200000 IJ SOLN
INTRAMUSCULAR | Status: DC | PRN
  Administered 2017-03-05: 10 mL

## 2017-03-05 MED ORDER — GENTAMICIN SULFATE 40 MG/ML IJ SOLN
80.0000 mg | Freq: Once | INTRAVENOUS | Status: DC
  Filled 2017-03-05: qty 2

## 2017-03-05 MED ORDER — ONDANSETRON HCL 4 MG/2ML IJ SOLN: 4.0000 mg | Freq: Once | INTRAMUSCULAR | Status: DC | PRN

## 2017-03-05 MED ORDER — DEXAMETHASONE SODIUM PHOSPHATE 10 MG/ML IJ SOLN
INTRAMUSCULAR | Status: AC
  Filled 2017-03-05: qty 1

## 2017-03-05 MED ORDER — VANCOMYCIN HCL IN DEXTROSE 1-5 GM/200ML-% IV SOLN
1000.0000 mg | Freq: Once | INTRAVENOUS | Status: AC
  Administered 2017-03-05: 1000 mg via INTRAVENOUS

## 2017-03-05 MED ORDER — DIPHENHYDRAMINE HCL 12.5 MG/5ML PO ELIX: 12.5000 mg | ORAL_SOLUTION | Freq: Four times a day (QID) | ORAL | Status: DC | PRN

## 2017-03-05 MED ORDER — PROPOFOL 500 MG/50ML IV EMUL: INTRAVENOUS | Status: DC | PRN

## 2017-03-05 MED ORDER — METHOCARBAMOL 500 MG PO TABS
750.0000 mg | ORAL_TABLET | Freq: Four times a day (QID) | ORAL | Status: DC
  Administered 2017-03-05: 1000 mg via ORAL
  Administered 2017-03-05 – 2017-03-07 (×6): 750 mg via ORAL
  Filled 2017-03-05 (×7): qty 2

## 2017-03-05 MED ORDER — ACETAMINOPHEN 325 MG PO TABS: 650.0000 mg | ORAL_TABLET | ORAL | Status: DC | PRN

## 2017-03-05 MED ORDER — VANCOMYCIN HCL IN DEXTROSE 1-5 GM/200ML-% IV SOLN
INTRAVENOUS | Status: AC
  Administered 2017-03-05: 1000 mg via INTRAVENOUS
  Filled 2017-03-05: qty 200

## 2017-03-05 MED ORDER — SUCCINYLCHOLINE CHLORIDE 20 MG/ML IJ SOLN
INTRAMUSCULAR | Status: AC
  Filled 2017-03-05: qty 1

## 2017-03-05 MED ORDER — SENNOSIDES-DOCUSATE SODIUM 8.6-50 MG PO TABS
1.0000 | ORAL_TABLET | Freq: Every evening | ORAL | Status: DC | PRN
  Administered 2017-03-06: 1 via ORAL
  Filled 2017-03-05: qty 1

## 2017-03-05 MED ORDER — PROPOFOL 10 MG/ML IV BOLUS
INTRAVENOUS | Status: DC | PRN
Start: 2017-03-05 — End: 2017-03-05
  Administered 2017-03-05: 130 mg via INTRAVENOUS

## 2017-03-05 MED ORDER — BACITRACIN 50000 UNITS IM SOLR
INTRAMUSCULAR | Status: AC
  Filled 2017-03-05: qty 1

## 2017-03-05 MED ORDER — SODIUM CHLORIDE 0.9% FLUSH
3.0000 mL | INTRAVENOUS | Status: DC | PRN
  Administered 2017-03-07: 3 mL via INTRAVENOUS
  Filled 2017-03-05: qty 3

## 2017-03-05 MED ORDER — VANCOMYCIN HCL IN DEXTROSE 1-5 GM/200ML-% IV SOLN
1000.0000 mg | Freq: Two times a day (BID) | INTRAVENOUS | Status: AC
  Administered 2017-03-05 – 2017-03-06 (×2): 1000 mg via INTRAVENOUS
  Filled 2017-03-05 (×2): qty 200

## 2017-03-05 MED ORDER — METHYLPREDNISOLONE ACETATE 40 MG/ML IJ SUSP
INTRAMUSCULAR | Status: AC
  Filled 2017-03-05: qty 1

## 2017-03-05 MED ORDER — OXYCODONE HCL 5 MG/5ML PO SOLN: 5.0000 mg | Freq: Once | ORAL | Status: DC | PRN

## 2017-03-05 MED ORDER — PROPOFOL 500 MG/50ML IV EMUL
INTRAVENOUS | Status: AC
  Filled 2017-03-05: qty 50

## 2017-03-05 MED ORDER — HYDROMORPHONE HCL 1 MG/ML IJ SOLN
INTRAMUSCULAR | Status: AC
  Administered 2017-03-05: 0.5 mg via INTRAVENOUS
  Filled 2017-03-05: qty 1

## 2017-03-05 MED ORDER — ACETAMINOPHEN 650 MG RE SUPP
650.0000 mg | RECTAL | Status: DC | PRN
Start: 2017-03-05 — End: 2017-03-07

## 2017-03-05 MED ORDER — ROCURONIUM BROMIDE 50 MG/5ML IV SOLN
INTRAVENOUS | Status: AC
  Filled 2017-03-05: qty 1

## 2017-03-05 MED ORDER — VANCOMYCIN HCL 1000 MG IV SOLR
INTRAVENOUS | Status: AC
  Filled 2017-03-05: qty 1000

## 2017-03-05 MED ORDER — ONDANSETRON HCL 4 MG/2ML IJ SOLN
INTRAMUSCULAR | Status: DC | PRN
  Administered 2017-03-05: 4 mg via INTRAVENOUS

## 2017-03-05 MED ORDER — FAMOTIDINE 20 MG PO TABS
ORAL_TABLET | ORAL | Status: AC
  Administered 2017-03-05: 20 mg via ORAL
  Filled 2017-03-05: qty 1

## 2017-03-05 MED ORDER — THROMBIN 5000 UNITS EX SOLR
CUTANEOUS | Status: AC
  Filled 2017-03-05: qty 10000

## 2017-03-05 MED ORDER — EPHEDRINE SULFATE 50 MG/ML IJ SOLN
INTRAMUSCULAR | Status: DC | PRN
  Administered 2017-03-05: 10 mg via INTRAVENOUS

## 2017-03-05 MED ORDER — FENTANYL CITRATE (PF) 100 MCG/2ML IJ SOLN
INTRAMUSCULAR | Status: DC | PRN
  Administered 2017-03-05: 100 ug via INTRAVENOUS
  Administered 2017-03-05 (×2): 50 ug via INTRAVENOUS

## 2017-03-05 MED ORDER — PHENYLEPHRINE HCL 10 MG/ML IJ SOLN
INTRAMUSCULAR | Status: DC | PRN
  Administered 2017-03-05 (×5): 100 ug via INTRAVENOUS
  Administered 2017-03-05: 200 ug via INTRAVENOUS

## 2017-03-05 MED ORDER — SUCCINYLCHOLINE CHLORIDE 20 MG/ML IJ SOLN
INTRAMUSCULAR | Status: DC | PRN
  Administered 2017-03-05: 100 mg via INTRAVENOUS

## 2017-03-05 MED ORDER — PROPOFOL 500 MG/50ML IV EMUL
INTRAVENOUS | Status: DC | PRN
  Administered 2017-03-05: 125 ug/kg/min via INTRAVENOUS

## 2017-03-05 MED ORDER — SODIUM CHLORIDE 0.9 % IV SOLN
INTRAVENOUS | Status: DC | PRN
  Administered 2017-03-05: 08:00:00 via INTRAVENOUS

## 2017-03-05 MED ORDER — ACETAMINOPHEN 500 MG PO TABS
1000.0000 mg | ORAL_TABLET | Freq: Four times a day (QID) | ORAL | Status: AC
  Administered 2017-03-05 – 2017-03-06 (×4): 1000 mg via ORAL
  Filled 2017-03-05 (×4): qty 2

## 2017-03-05 MED ORDER — REMIFENTANIL HCL 1 MG IV SOLR
INTRAVENOUS | Status: AC
  Filled 2017-03-05: qty 2000

## 2017-03-05 MED ORDER — BACITRACIN 50000 UNITS IM SOLR
INTRAMUSCULAR | Status: DC | PRN
  Administered 2017-03-05: 50000 [IU]

## 2017-03-05 SURGICAL SUPPLY — 66 items
BONE CANC CHIPS 20CC PCAN1/4 (Bone Implant) ×3 IMPLANT
BULB RESERV EVAC DRAIN JP 100C (MISCELLANEOUS) ×3 IMPLANT
BUR NEURO DRILL SOFT 3.0X3.8M (BURR) ×3 IMPLANT
BUR SABER DIAMOND 3.0 (BURR) IMPLANT
BUR SABER DIAMOND 5.0 (BURR) IMPLANT
CANISTER SUCT 1200ML W/VALVE (MISCELLANEOUS) ×3 IMPLANT
CHIPS CANC BONE 20CC PCAN1/4 (Bone Implant) ×1 IMPLANT
CHLORAPREP W/TINT 26ML (MISCELLANEOUS) ×3 IMPLANT
CLIP NEUROVISION LG (CLIP) ×3 IMPLANT
CUP MEDICINE 2OZ PLAST GRAD ST (MISCELLANEOUS) ×3 IMPLANT
DERMABOND ADVANCED (GAUZE/BANDAGES/DRESSINGS)
DERMABOND ADVANCED .7 DNX12 (GAUZE/BANDAGES/DRESSINGS) IMPLANT
DRAIN CHANNEL JP 10F RND 20C F (MISCELLANEOUS) ×3 IMPLANT
DRAPE C-ARMOR (DRAPES) ×3 IMPLANT
DRAPE INCISE IOBAN 66X45 STRL (DRAPES) ×3 IMPLANT
DRAPE SURG 17X11 SM STRL (DRAPES) ×12 IMPLANT
DRSG TEGADERM 4X4.75 (GAUZE/BANDAGES/DRESSINGS) ×3 IMPLANT
ELECT EZSTD 165MM 6.5IN (MISCELLANEOUS) ×3
ELECTRODE EZSTD 165MM 6.5IN (MISCELLANEOUS) ×1 IMPLANT
FEE INTRAOP MONITOR IMPULS NCS (MISCELLANEOUS) ×1 IMPLANT
GLOVE BIO SURGEON STRL SZ7.5 (GLOVE) ×3 IMPLANT
GLOVE BIOGEL PI IND STRL 8 (GLOVE) ×1 IMPLANT
GLOVE BIOGEL PI INDICATOR 8 (GLOVE) ×2
GOWN STRL REUS W/ TWL LRG LVL3 (GOWN DISPOSABLE) ×1 IMPLANT
GOWN STRL REUS W/ TWL XL LVL3 (GOWN DISPOSABLE) ×1 IMPLANT
GOWN STRL REUS W/TWL LRG LVL3 (GOWN DISPOSABLE) ×2
GOWN STRL REUS W/TWL XL LVL3 (GOWN DISPOSABLE) ×2
GUIDEWIRE NITINOL BEVEL TIP (WIRE) ×12 IMPLANT
IMPL TLX 9X11X25MM 15DEG (Cage) ×1 IMPLANT
INTRAOP MONITOR FEE IMPULS NCS (MISCELLANEOUS) ×1
INTRAOP MONITOR FEE IMPULSE (MISCELLANEOUS) ×2
KIT ACCESS MAXCESS MAS TLIF 2 (KITS) ×3 IMPLANT
KIT NEEDLE NVM5 EMG ELECT (KITS) ×1 IMPLANT
KIT NEEDLE NVM5 EMG ELECTRODE (KITS) ×2
KIT RM TURNOVER STRD PROC AR (KITS) ×3 IMPLANT
KIT SPINAL PRONEVIEW (KITS) ×3 IMPLANT
MARKER SKIN DUAL TIP RULER LAB (MISCELLANEOUS) ×3 IMPLANT
NEEDLE I PASS (NEEDLE) ×6 IMPLANT
NS IRRIG 1000ML POUR BTL (IV SOLUTION) ×3 IMPLANT
PACK LAMINECTOMY NEURO (CUSTOM PROCEDURE TRAY) ×3 IMPLANT
PATTIES SURGICAL .5 X.5 (GAUZE/BANDAGES/DRESSINGS) ×3 IMPLANT
PATTIES SURGICAL .5X1.5 (GAUZE/BANDAGES/DRESSINGS) IMPLANT
PRECEPT NIT K-WIRE ×12 IMPLANT
PROBE BALL TIP NVM5 SNG USE (BALLOONS) ×3 IMPLANT
PUTTY DBX 5CC (Putty) ×3 IMPLANT
REDUCTION EXT RELINE MAS MOD (Neuro Prosthesis/Implant) ×6 IMPLANT
ROD RELINE MAS LORD 5.5X45MM (Rod) ×3 IMPLANT
ROD RELINE MAS TI LORD 5.5X40 (Rod) ×3 IMPLANT
SCREW LOCK RELINE 5.5 TULIP (Screw) ×12 IMPLANT
SCREW RELINE RED 6.5X45MM POLY (Screw) ×6 IMPLANT
SCREW RELINE RED 6.5X50MM POLY (Screw) ×6 IMPLANT
SCREW SHANK MAS MOD 6.5X50MM (Screw) ×3 IMPLANT
SCREW SHANK RELINE 6.5X45MM 2C (Screw) ×3 IMPLANT
SPOGE SURGIFLO 8M (HEMOSTASIS) ×4
SPONGE DRAIN TRACH 4X4 STRL 2S (GAUZE/BANDAGES/DRESSINGS) ×3 IMPLANT
SPONGE LAP 18X18 5 PK (GAUZE/BANDAGES/DRESSINGS) ×9 IMPLANT
SPONGE SURGIFLO 8M (HEMOSTASIS) ×2 IMPLANT
STAPLER SKIN PROX 35W (STAPLE) ×3 IMPLANT
SUT MNCRL AB 3-0 PS2 27 (SUTURE) IMPLANT
SUT VIC AB 0 CT1 27 (SUTURE) ×6
SUT VIC AB 0 CT1 27XCR 8 STRN (SUTURE) ×3 IMPLANT
SUT VIC AB 2-0 CT1 18 (SUTURE) ×12 IMPLANT
SUT VICRYL 0 UR6 27IN ABS (SUTURE) ×3 IMPLANT
TLX IMPLANT 9X11X25MM 15DEG (Cage) ×3 IMPLANT
TOWEL OR 17X26 4PK STRL BLUE (TOWEL DISPOSABLE) ×12 IMPLANT
TRAY FOLEY CATH SILVER 16FR LF (SET/KITS/TRAYS/PACK) ×3 IMPLANT

## 2017-03-05 NOTE — Anesthesia Postprocedure Evaluation (Signed)
Anesthesia Post Note  Patient: Teresa Dudley  Procedure(s) Performed: MINIMALLY INVASIVE (MIS) TRANSFORAMINAL LUMBAR INTERBODY FUSION (TLIF) 1 LEVEL-L4-L5 (Left )  Patient location during evaluation: PACU Anesthesia Type: General Level of consciousness: awake and alert Pain management: pain level controlled Vital Signs Assessment: post-procedure vital signs reviewed and stable Respiratory status: spontaneous breathing, nonlabored ventilation, respiratory function stable and patient connected to nasal cannula oxygen Cardiovascular status: blood pressure returned to baseline and stable Postop Assessment: no apparent nausea or vomiting Anesthetic complications: no     Last Vitals:  Vitals:   03/05/17 1215 03/05/17 1230  BP: (!) 143/95 124/73  Pulse: 92 91  Resp: 12 11  Temp:  36.7 C  SpO2: 97% 95%    Last Pain:  Vitals:   03/05/17 1230  PainSc: 5                  [name] [name]

## 2017-03-05 NOTE — Care Management Note (Signed)
Case Management Note  Patient Details  Name: Teresa Dudley MRN: [id number] Date of Birth: 03/07/1961  Subjective/Objective:                   RNCM met with patient to discuss discharge planning. She is from home with her daughter Museum/gallery conservator. She hopes to be able to return to home at discharge. She states she is usually very independent at home. She has not DME available for use at home. She uses [facility] for medications.  Action/Plan:  Home health list provided to patient for review. RNCM will continue to follow.   Expected Discharge Date:  03/07/17               Expected Discharge Plan:     In-House Referral:     Discharge planning Services     Post Acute Care Choice:  Home Health, Durable Medical Equipment Choice offered to:  Patient  DME Arranged:    DME Agency:     HH Arranged:    [place] Agency:     Status of Service:  In process, will continue to follow  If discussed at Long Length of Stay Meetings, dates discussed:    Additional Comments:  Marshell Garfinkel, RN 03/05/2017, 5:04 PM

## 2017-03-05 NOTE — Progress Notes (Signed)
Few spots of blood to back dsg and marked

## 2017-03-05 NOTE — Progress Notes (Signed)
Chaplain responded to an OR for Advanced Directives. CH met pt, educated pt on how to complete AD. Pt states she would like to review material with daughter and will contact [place] when ready to complete. [place] available to follow up as needed.   03/05/17 1600  Clinical Encounter Type  Visited With Patient  Visit Type Initial;Other (Comment)  Referral From Nurse  Consult/Referral To Chaplain  Spiritual Encounters  Spiritual Needs Literature

## 2017-03-05 NOTE — H&P (Signed)
Chief Complaint: F/u to MRI Cervical Spine  History of Present Illness: Teresa Dudley isEspyn Dudley. female who presents today for scheduled follow-up for preoperative planning. He was last seen in clinic with complaints of chronic low back pain that worsens with flexion movements that was radiating down her lateral aspect of her thigh to the top of her foot most notably in the left. After further discussion she also complained of some upper extremity symptoms and an MRI of her cervical spine was obtained. We did also obtain a bone density scan which showed osteopenia and she has seen endocrinology for consideration of Forteo therapy as she has a spondylolisthesis and is failed conservative measures. She had the epidural steroid injections in July 2018 without any substantial improvement. Therefore she is ready to proceed with surgical considerations. Since her last evaluation she denies any recurrence of her numbness in her hands or dropping of objects. She denies any weakness. She continues to have left lower extremity discomfort and minimal if any on the right.  She recently had OR cancelled due to an open sore from a cut caused by her dog. She completed the antibiotics and did not develop any drainage or fevers. It apepars well healed.  Review of Systems:  A 10 point review of systems is negative, except for the pertinent positives and negatives detailed in the HPI.  Past Medical History: Past Medical History:  Diagnosis Date  . B12 deficiency 10/10/2016  Lab 5/18  . DDD (degenerative disc disease), lumbar  . Lumbar radiculopathy  . Lumbar stenosis  . Osteopenia  . Other hyperlipidemia 10/10/2016   Past Surgical History: Past Surgical History:  Procedure Laterality Date  . CHOLECYSTECTOMY  . DEBRIDEMENT AND IRRIGATION OF RIGHT RING FINGER FLEXOR TENOSYNOVITIS Right 12/19/2015  . TUBAL LIGATION   Allergies: Allergies as of 01/09/2017  . (No Known Allergies)   Medications: Outpatient  Encounter Prescriptions as of 01/09/2017  Medication Sig Dispense Refill  . cyanocobalamin (VITAMIN B12) 1000 MCG tablet Take 2,000 mcg by mouth once daily.  Marland Kitchen HYDROcodone-acetaminophen (NORCO) 5-325 mg tablet Take 1 tablet by mouth every 8 (eight) hours as needed for Pain. 60 tablet 0  . naproxen (NAPROSYN) 500 MG tablet Take 1 tablet (500 mg total) by mouth 2 (two) times daily with meals. 180 tablet 3  . teriparatide (FORTEO) 20 mcg/dose - 600 mcg/2.4 mL pen injector Inject 0.08 mLs (20 mcg total) subcutaneously once daily. Diagnosis: M85.89 2.4 mL 5  . traZODone (DESYREL) 100 MG tablet Take 100 mg by mouth nightly.  . [DISCONTINUED] HYDROcodone-acetaminophen (NORCO) 5-325 mg tablet Take 1 tablet by mouth every 8 (eight) hours as needed for Pain. 60 tablet 0   No facility-administered encounter medications on file as of 01/09/2017.   Social History: Social History  Substance Use Topics  . Smoking status: Former Smoker  Types: Cigarettes  . Smokeless tobacco: Never Used  . Alcohol use Yes  Comment: social   Family Medical History: Family History  Problem Relation Age of Onset  . Breast cancer Daughter  . Lung cancer Mother  . High blood pressure (Hypertension) Mother  . Thyroid disease Mother  . Myocardial Infarction (Heart attack) Father  . Stroke Father  . Thyroid disease Sister  . High blood pressure (Hypertension) Brother  . Diabetes type II Brother   Physical Examination: There were no vitals filed for this visit.  General: Patient is well developed, well nourished, calm, collected, and in no apparent distress.  Psychiatric: Patient is non-anxious.  Head: Pupils equal, round, and reactive to light.  ENT: Oral mucosa appears well hydrated.  Neck: Supple. Full range of motion.  Respiratory: Patient is breathing without any difficulty.  Extremities: No edema.  Vascular: Palpable pulses.  Skin: On exposed skin, there are no abnormal skin lesions.  NEUROLOGICAL:   General: In no acute distress.  Awake, alert, oriented to person, place, and time. Pupils equal round and reactive to light. Facial tone is symmetric. Tongue protrusion is midline.   ROM of spine: full. Palpation of spine: no TTP  Lhermitte: neg Spurling: neg Chest: Clear bilatearlly CV: S1S2. RRR  Strength:   Side Iliopsoas Quads Hamstring PF DF EHL  R            L            4+ 5   Llower extremity sensation is intact to light touch and pin prick except left L4. Clonus is not present.   Imaging: MRI Cervical Spine: MRI CERVICAL SPINE WITHOUT CONTRAST  TECHNIQUE: Multiplanar, multisequence MR imaging of the cervical spine was performed. No intravenous contrast was administered.  COMPARISON:None.  FINDINGS: Alignment: Straightening with slight C7-T1 anterolisthesis.  Vertebrae: C5-6 non segmentation. No fracture deformity, discitis, or aggressive bone lesion.  Cord: Normal signal and morphology.  Posterior Fossa, vertebral arteries, paraspinal tissues: Mucous layers in a sphenoid sinus.  Disc levels:  C2-3: Facet spurring asymmetric to the right. Patent canal and foramina.  C3-4: Degenerative disc narrowing with bilateral uncovertebral ridging. Moderate bilateral foraminal narrowing. Posterior disc osteophyte complex near completely effaces ventral and dorsal CSF. No cord deformity. Facet arthropathy has led to left-sided ankylosis.  C4-5: Degenerative disc narrowing with endplate uncovertebral spurring. Advanced bilateral foraminal impingement. Mild spinal stenosis with mild ventral cord flattening.  C5-6: Non segmentation.  C6-7: Disc degeneration with advanced narrowing, endplate ridging, and uncovertebral spurring. Posterior disc osteophyte complex, predominately disc, causes mild cord flattening ventrally. Bilateral foraminal impingement,  advanced.  C7-T1:Facet arthropathy with spurring that is moderate. Left foraminal narrowing that is mild to moderate. Patent spinal canal  IMPRESSION: 1. C4-5 and C6-7 prominent disc degeneration, accelerated by a C5-6 non segmentation. At both levels, spurring causes advanced bilateral foraminal impingement. Disc osteophyte complexes cause spinal stenosis with mild cord deformity. 2. C3-4 moderate bilateral foraminal stenosis. Mild spinal stenosis without cord mass effect. Ankylosis at the degenerated left facet.  CT Lumbar Spine:  IMPRESSION: 1. Advanced facet hypertrophy and spurring at L4-5 with 8 mm anterolisthesis and uncovering of a broad-based disc protrusion resulting in severe central canal stenosis with left greater than right subarticular narrowing. 2. Moderate left and mild right foraminal stenosis at L4-5. 3. Facet disease at L3-4 and L5-S1 without significant stenosis at these levels. 4. Mild left subarticular and foraminal stenosis at L2-3. 5. Mild subarticular and foraminal narrowing bilaterally at L1-2. 6. Moderate right and mild left foraminal narrowing at T12-L1. 7. Inferior disc extrusion at T12-L1 could impact the right L1 nerve roots within the lateral recess.  I have personally reviewed the images  and agree with the above interpretation.  Assessment and Plan: Ms. Mccarron is a pleasant 56 y.o. female with evidence of a grade 1 spondylolisthesis with significant foraminal narrowing and L4 radiculopathy on the left. She is failed conservative measures with her most recent epidural steroid injection December 19, 2016. I have therefore offered her minimally invasive left-sided approach for transforaminal lumbar interbody fusion at L4/5 for stabilization decompression across this segment. I discussed the risks and benefits of surgery. I reviewed her cervical spine MRI which reveals foraminal stenosis with mild canal stenosis.  She is currently asymptomatic in her upper  extremities. Patient family opportunity to ask questions and all were answered. Patient recently had OR rescheduled due to an open cut on her arm from her dog. She was given a 10 day course of Bactrim, which she complete. She did not develop any drainage or infection from that cut. No other changes since our last visit.  Thank you for involving me in the care of this patient. I will keep you apprised of the patient's progress.  This note was partially dictated using voice recognition software, so please excuse any errors that were not corrected.   Noralee Stain, MD

## 2017-03-05 NOTE — Transfer of Care (Signed)
Immediate Anesthesia Transfer of Care Note  Patient: Teresa Dudley  Procedure(s) Performed: MINIMALLY INVASIVE (MIS) TRANSFORAMINAL LUMBAR INTERBODY FUSION (TLIF) 1 LEVEL-L4-L5 (Left )  Patient Location: PACU  Anesthesia Type:General  Level of Consciousness: awake and responds to stimulation  Airway & Oxygen Therapy: Patient Spontanous Breathing and Patient connected to face mask oxygen  Post-op Assessment: Report given to RN and Post -op Vital signs reviewed and stable  Post vital signs: Reviewed and stable  Last Vitals:  Vitals:   03/05/17 1110 03/05/17 1114  BP: (!) 143/90 (!) 143/90  Pulse: 99 93  Resp:    Temp: 36.8 C   SpO2: 98% 99%    Last Pain:  Vitals:   03/05/17 0654  PainSc: 7          Complications: No apparent anesthesia complications

## 2017-03-05 NOTE — Progress Notes (Signed)
Pharmacy Antibiotic Note  Teresa Dudley is a 56 y.o. female admitted on 03/05/2017 with surgical prophylaxis.  Pharmacy has been consulted for vanc/gent dosing.  Plan: Will give vanc 1g IV x 1  And Gent 80 mg IV x 1  Height:  (149.9 cm) Weight: 150 lb (68 kg) IBW/kg (Calculated) : 43.2  Temp (24hrs), Avg:97.7 F (36.5 C), Min:97.7 F (36.5 C), Max:97.7 F (36.5 C)  No results for input(s): WBC, CREATININE, LATICACIDVEN, VANCOTROUGH, VANCOPEAK, VANCORANDOM, GENTTROUGH, GENTPEAK, GENTRANDOM, TOBRATROUGH, TOBRAPEAK, TOBRARND, AMIKACINPEAK, AMIKACINTROU, AMIKACIN in the last 168 hours.  Estimated Creatinine Clearance: 66.6 mL/min (by C-G formula based on SCr of 0.63 mg/dL).    Allergies  Allergen Reactions  . Gabapentin Anxiety    Depression and anxiety     Thank you for allowing pharmacy to be a part of this patient's care.  Thomasene Ripple, PharmD, BCPS Clinical Pharmacist 03/05/2017

## 2017-03-05 NOTE — Op Note (Addendum)
Date of Surgery: 03/05/2017  Preoperative Diagnosis:  Lumbar Spondylolisthesis L4/5 Lumbar Radiculopathy Mechanical and Axial back pain  Postoperative Diagnosis: same  Attending: Dr. Noralee Stain  Assistant: Ivar Drape, PA  Procedure:  L4/5 MAS TLIF  EBL: 300cc  Crystalloid: 800cc  Indications: progressive lumbar radiculopathy and mechanical/axial back pain in setting of spondylolisthesis with increasing pain despite maximizing conservative measures.  Specimen: none  Anesthesia:  GETA; 10cc 0.25% Bupivicaine w/ epinephrine  Disposition: PACU/floor recovery  JP drain: left paraspinal 10 F JP  Instrumentation: Nuvasive Reline  Findings: EMG stimulation was found to be greater than 18mA on all pedicle screws.  Risks of surgery discussed include: infection, bleeding, epidural/subdural hematoma, nerve root/spinal cord injury, failed hardware, back pain, brachial plexus palsy, CSF leak, development of deformity, persistent symptoms, aorta injury, pneumothorax, vena cave injury, hemothorax, need for chest tube placement, pseudoarthrosis, adjacent segment disease, failed hardware, and the risks of anesthesia.  Operative Note:   The patient was brought from the preoperative center with intravenous access established.  Patient then underwent general anesthesia and endotracheal tube intubation.  A Foley catheter was then placed and the patient was rotated onto the [facility] table.  All pressure points were appropriately padded.  Neuro monitoring leads were placed on the patient preoperatively including Nuvasive EMG leads.  Fluoroscopy was then brought into the field and we localized to the appropriate level counting from the sacrum. Under AP and Lateral fluorscopy, pedicle borders were established and marked with a marking pen at L4 and L5.    Sterile prep and drapes were then applied and a timeout was observed.  Perioperative antibiotic prophylaxis was administered for this case. Under  sterile AP.Lat flouro w use of a local anesthetic needle we confirmed the pedicles at L4 and L5. A stab incision was made with a #10 blade knife. A Jamshidi needle was advanced to 25mm under AP and no medial breach was noted. Then under lateral imaging we advance to proximal vertebral body. K wires were then advanced distal to this. The needle was removed and on the right side from the [facility] MAS TLIF system L4 and L5 measuring 6.5x4mm and 6.5x18mm at L5.  On the left they were left slightly proud to accomodate the retractor blades.  The incision was then connected with a 10 blade knife and fascia opened with a bovie. Cobb elevator was then used at L4/5  to mobilize tissue medially and medial retractor blades as well as rostral caudal pedicle based retractors were positioned. The bovie extender was then used to hemostasis and a 3-0 curette to dissect caudal lamina. A 1/2in curved osteotome was then used to remove the inferior articulating process. A 3mm matchstick drill bit was used to complete the laminectomy and foramintomy til ligamentum flavum was noted. I then used Kerrison 1,2,3, and 4 to complete decompression as removal of the superior articulating tip for foraminotomy purposes. The thecal sac was noted to have improved caliber once complete and the exiting nerve root of L4 was visualized. I completed this til midline spinous process and laterally through the pars. I then extended over the midline and undercut the spinous process and lamina from the contralateral side removing midline ligamentum flavum as well for central decompression. A valsalva maneuver was perfomed and no evidence of CSF leakage was noted. A nerve root retractor was advanced and the disc space exposed on the left L4/5. With a 15 blade knife annulotomy was made and disc space was removed with pituitary rongeur. 7-10 mm paddles  were advanced and after endplate preparation was complete with rasp, the bone funnel was introduced to place  local autograft/allograft milled together. Once packed a 9-29mm x 26mm expandable Nuvasive TLIF cage was placed under fluoro. The interbody cage was then post-packed with allograft mixed with local autograft. Once fluoro was confirmed for proper placement.The left sided pedicle screws were driven down to hand tight. Rod measurements were obtained and a  placed on the left and 40mm on the right (once the left side tulip heads were placed). Rod was verified under xray to be rostral caudal to tulip heads. The caudal level was tightened and we compressed across each disc space level. Final tightening was then completed. We tunneled out a 49F JP drain on the left paraspinal region and anchored in place with a 2-0 vicryl suture.Final xrays were obtained confirming proper placement of hardware including rods. No EMG motor or SSEP changes noted. 1 gram vancomycin powder was placed in the left paraspinal incision. A total of 10cc 0.25% bupivicaine with epi was injected into the subcutaneous and muscular layers.The tulip towers were removed and the fascial/muscle reapproximated with 0-vicryl suture, subcutaneous tissue with 2-0 vicryl suture and skin with a skin stapler. Skin was cleansed wet to dry a sterile dressing was placed over incisions. All counts were correct at the conclusion of the case.  I was present for all portions of the procedure.   Noralee Stain, MD

## 2017-03-05 NOTE — Anesthesia Post-op Follow-up Note (Signed)
Anesthesia QCDR form completed.        

## 2017-03-05 NOTE — Anesthesia Procedure Notes (Signed)
Procedure Name: Intubation Performed by: Casey Burkitt Pre-anesthesia Checklist: Patient identified, Patient being monitored, Timeout performed, Emergency Drugs available and Suction available Patient Re-evaluated:Patient Re-evaluated prior to induction Oxygen Delivery Method: Circle system utilized Preoxygenation: Pre-oxygenation with 100% oxygen Induction Type: IV induction Ventilation: Mask ventilation without difficulty Laryngoscope Size: 3 and Glidescope Grade View: Grade II Tube type: Oral Tube size: 6.5 mm Number of attempts: 2 Airway Equipment and Method: Stylet and Video-laryngoscopy Placement Confirmation: ETT inserted through vocal cords under direct vision,  positive ETCO2 and breath sounds checked- equal and bilateral Secured at: 21 cm Tube secured with: Tape Dental Injury: Teeth and Oropharynx as per pre-operative assessment  Difficulty Due To: Difficult Airway- due to anterior larynx Future Recommendations: Recommend- induction with short-acting agent, and alternative techniques readily available

## 2017-03-05 NOTE — Progress Notes (Signed)
  History: Teresa Dudley is POD0 MIS TLIF 1 level L4/5. Patient tolerated procedure well; no complications. Symptoms prior to surgery have resolved. Patient complains of back pain 5/10 and is being controlled with PCA dilaudid.  Denies lower extremity symptoms bilaterally (numbness, tingling, pain)  Physical Exam: Vitals:   03/05/17 1215 03/05/17 1230  BP: (!) 143/95 124/73  Pulse: 92 91  Resp: 12 11  Temp:  98.1 F (36.7 C)  SpO2: 97% 95%    AA Ox3 CNI Skin: dressing changed. Incision site intact.  Strength:5/5 throughout, sensation intact upper and lower extremities.   Data: EXAM: LUMBAR SPINE - 2-3 VIEW  COMPARISON:  Lumbar CT December 25, 2016  FINDINGS: Frontal, lateral, and spot lumbosacral lateral images were obtained. There are 5 non-rib-bearing lumbar type vertebral bodies. The patient has had posterior screw and plate fixation at L4 and L5 with disc spacer placed at L4-5. Screw tips are in the respective vertebral bodies. No fracture. There is 9 mm of anterolisthesis of L4 on L5. No other spondylolisthesis. There is moderate disc space narrowing at T12-L1 and L1-2. There is also degenerative change in the visualize lower thoracic spine.  IMPRESSION: Postoperative change at L4 and L5 with support hardware intact. Screw tips are in the respective vertebral bodies. There is spondylolisthesis at L4-5, stable. 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   Electronically Signed   By: Bretta Bang III M.D.   On: 03/05/2017 14:30  Assessment/Plan:  Teresa Dudley is POD0 s/p MIS TLIF L4/5. Patient is doing well overall. No neuro deficits on exam. Symptoms present to surgery have resolved. PCA for tonight with oxycodone as breakthrough. Robaxin 750 mg scheduled QID.  Imaging complete.   - mobilize - pain control - DVT prophylaxis  Ivar Drape PA-C Department of Neurosurgery

## 2017-03-05 NOTE — Progress Notes (Signed)
Pharmacy Antibiotic Note  Teresa Dudley is a 56 y.o. female admitted on 03/05/2017 with surgical prophylaxis.  Pharmacy has been consulted for vancomycin dosing.  Pt received vancomycin 1 g IV x1 at 0716 this AM.  Spoke with Dr. Teola Bradley who would like pt to receive two more doses (i.e. 24h) of post-op vancomycin for prophylaxis as pt has a drain in place. MD stated he would order a BMP. Next dose due at 1930 per MD.  Per MD can stop gentamicin.   Plan: Will await SCr/renal function   Height:  (149.9 cm) Weight: 150 lb (68 kg) IBW/kg (Calculated) : 43.2  Temp (24hrs), Avg:97.9 F (36.6 C), Min:97.5 F (36.4 C), Max:98.3 F (36.8 C)  No results for input(s): WBC, CREATININE, LATICACIDVEN, VANCOTROUGH, VANCOPEAK, VANCORANDOM, GENTTROUGH, GENTPEAK, GENTRANDOM, TOBRATROUGH, TOBRAPEAK, TOBRARND, AMIKACINPEAK, AMIKACINTROU, AMIKACIN in the last 168 hours.  CrCl cannot be calculated (Patient's most recent lab result is older than the maximum 21 days allowed.).    Allergies  Allergen Reactions  . Gabapentin Anxiety    Depression and anxiety      Thank you for allowing pharmacy to be a part of this patient's care.  Marty Heck 03/05/2017 3:54 PM

## 2017-03-05 NOTE — Progress Notes (Signed)
Pharmacy Antibiotic Note  Teresa Dudley is a 56 y.o. female admitted on 03/05/2017 with surgical prophylaxis.  Pharmacy has been consulted for vancomycin dosing.  Pt received vancomycin 1 g IV x1 at 0716 this AM.  Spoke with Dr. Teola Bradley who would like pt to receive two more doses (i.e. 24h) of post-op vancomycin for prophylaxis as pt has a drain in place. MD stated he would order a BMP. Next dose due at 1930 per MD.  Per MD can stop gentamicin.   Plan: Scr: 0.86 CrCl: 99ml/ml Ke: 0.058   Vd: 47.6    T1/2: 11.9  Will order vancomycin 1g IV every 12 hours x 2 doses.   Height:  (149.9 cm) Weight: 150 lb (68 kg) IBW/kg (Calculated) : 43.2  Temp (24hrs), Avg:97.9 F (36.6 C), Min:97.5 F (36.4 C), Max:98.3 F (36.8 C)   Recent Labs Lab 03/05/17 1641  WBC 13.7*  CREATININE 0.86    Estimated Creatinine Clearance: 62 mL/min (by C-G formula based on SCr of 0.86 mg/dL).    Allergies  Allergen Reactions  . Gabapentin Anxiety    Depression and anxiety      Thank you for allowing pharmacy to be a part of this patient's care.  Gardner Candle, PharmD, BCPS Clinical Pharmacist 03/05/2017 6:01 PM

## 2017-03-06 ENCOUNTER — Encounter: Payer: Self-pay | Admitting: Neurological Surgery

## 2017-03-06 MED ORDER — HYDROMORPHONE HCL 1 MG/ML IJ SOLN
1.0000 mg | INTRAMUSCULAR | Status: DC | PRN
  Administered 2017-03-06: 1 mg via INTRAVENOUS
  Filled 2017-03-06: qty 1

## 2017-03-06 NOTE — Progress Notes (Signed)
Physical Therapy Evaluation Patient Details Name: Teresa Dudley MRN: [id number] DOB: 05-May-1961 Today's Date: 03/06/2017   History of Present Illness  Pt admitted for lumbar radiculopathy, s/p transforaminal lumbar interbody fusion at L4-L5 on 10/15, had received epidural injections in July 2018 with no improvement.   Clinical Impression  Pt is a pleasant 56 year old female admitted for lumbar radiculopathy. Pt educated on log roll technique for bed mobility and lumbar precautions for all mobility activities. Pt performed there-ex, bed mobility and transfers with I., amb with CGA. Pt amb one lap around nurse's station safely without an assistive device, takes short steps, able to maintain steady pace, no SOB or LOB. Assisted pt to bathroom. Pt demonstrates deficits with LE strength and amb. Would benefit from further skilled PT services to promote optimal return to home. Recommend transition to outpatient PT upon DC from acute hospitalization.    Follow Up Recommendations Outpatient PT    Equipment Recommendations  None recommended by PT    Recommendations for Other Services       Precautions / Restrictions Precautions Precautions: Fall;Back Precaution Booklet Issued: No Precaution Comments: No bending, lifting, twisting Restrictions Weight Bearing Restrictions: No      Mobility  Bed Mobility Overal bed mobility: Needs Assistance Bed Mobility: Supine to Sit     Supine to sit: Independent     General bed mobility comments: Pt educated on log roll technique and to maintain precautions. Cues for proper technique, no assist needed.  Transfers Overall transfer level: Needs assistance Equipment used: Rolling walker (2 wheeled) Transfers: Sit to/from Stand Sit to Stand: Independent         General transfer comment: Pt transferred from seated EOB to standing with RW, able to perform safely with proper technique and no assist. Pt able to safely transfer from toilet, no cues  or assistive device needed.   Ambulation/Gait Ambulation/Gait assistance: Min guard Ambulation Distance (Feet): 230 Feet Assistive device: Rolling walker (2 wheeled);None Gait Pattern/deviations: Step-through pattern     General Gait Details: Pt amb with alternating gait, short steps, able to maintain steady pace. Pt initially amb with RW, however appears safe to amb without it and does not require an assistive device.  Stairs Stairs:  (Pt states feeling comfortable going up/down one step at home)          Wheelchair Mobility    Modified Rankin (Stroke Patients Only)       Balance Overall balance assessment: Needs assistance Sitting-balance support: Feet supported Sitting balance-Leahy Scale: Good Sitting balance - Comments: No assistance needed   Standing balance support: No upper extremity supported Standing balance-Leahy Scale: Good Standing balance comment: No assistance needed                             Pertinent Vitals/Pain Pain Assessment: 0-10 Pain Score: 6  Pain Location: back Pain Intervention(s): Limited activity within patient's tolerance;Monitored during session;Repositioned    Home Living Family/patient expects to be discharged to:: Private residence Living Arrangements: Children (daughter) Available Help at Discharge: Family Type of Home: House Home Access: Stairs to enter Entrance Stairs-Rails: Can reach both;Left;Right Entrance Stairs-Number of Steps: 1 Home Layout: One level Home Equipment: None      Prior Function Level of Independence: Independent         Comments: Pt able to do all ADLs and IADLs independently.      Hand Dominance        Extremity/Trunk  Assessment   Upper Extremity Assessment Upper Extremity Assessment: Generalized weakness (UE MMT grossly 4/5)    Lower Extremity Assessment Lower Extremity Assessment: Generalized weakness (LE MMT grossly 4/5)    Cervical / Trunk Assessment Cervical / Trunk  Assessment: Normal  Communication   Communication: No difficulties  Cognition Arousal/Alertness: Awake/alert Behavior During Therapy: WFL for tasks assessed/performed Overall Cognitive Status: Within Functional Limits for tasks assessed                                        General Comments      Exercises Other Exercises Other Exercises: Supine ther-ex 10x, B ankle pumps, hip abd/add, SLRs. Cues for proper technique, no assist needed.  Other Exercises: Assisted pt bathroom for toileting   Assessment/Plan    PT Assessment Patient needs continued PT services  PT Problem List Decreased strength;Decreased range of motion;Decreased mobility;Decreased activity tolerance;Decreased knowledge of use of DME;Pain       PT Treatment Interventions DME instruction;Gait training;Stair training;Functional mobility training;Therapeutic activities;Therapeutic exercise;Patient/family education    PT Goals (Current goals can be found in the Care Plan section)  Acute Rehab PT Goals Patient Stated Goal: to get better PT Goal Formulation: With patient Time For Goal Achievement: 03/20/17 Potential to Achieve Goals: Good Additional Goals Additional Goal #1: Pt will safely perform all mobility activities and maintain bending, lifting, twisting, precautions    Frequency 7X/week   Barriers to discharge        Co-evaluation               AM-PAC PT "6 Clicks" Daily Activity  Outcome Measure Difficulty turning over in bed (including adjusting bedclothes, sheets and blankets)?: None Difficulty moving from lying on back to sitting on the side of the bed? : None Difficulty sitting down on and standing up from a chair with arms (e.g., wheelchair, bedside commode, etc,.)?: None Help needed moving to and from a bed to chair (including a wheelchair)?: None Help needed walking in hospital room?: None Help needed climbing 3-5 steps with a railing? : A Little 6 Click Score: 23     End of Session Equipment Utilized During Treatment: Gait belt Activity Tolerance: Patient tolerated treatment well Patient left: in chair;with call bell/phone within reach;with chair alarm set Nurse Communication: Mobility status PT Visit Diagnosis: Other abnormalities of gait and mobility (R26.89);Pain;Muscle weakness (generalized) (M62.81) Pain - part of body:  (back)    Time: 4166-0630 PT Time Calculation (min) (ACUTE ONLY): 23 min   Charges:   PT Evaluation $PT Eval Low Complexity: 1 Low PT Treatments $Therapeutic Exercise: 8-22 mins   PT G Codes:   PT G-Codes **NOT FOR INPATIENT CLASS** Functional Assessment Tool Used: AM-PAC 6 Clicks Basic Mobility Functional Limitation: Mobility: Walking and moving around Mobility: Walking and Moving Around Current Status (Z6010): At least 1 percent but less than 20 percent impaired, limited or restricted Mobility: Walking and Moving Around Goal Status [phone]): 0 percent impaired, limited or restricted    Renford Dills, SPT  Renford Dills 03/06/2017, 1:34 PM

## 2017-03-06 NOTE — Progress Notes (Signed)
Pain currently 10/10 but was assessed just before receiving pain medication and robaxin. Informed patient that medications are PRN and to let nurse know when she is feeling pain. Patient expressed understanding.  She does state that she has been much more active today as well.  Strength and sensation intact. Patient ambulated to bathroom. No issues with voiding.  PT evaluation was positive.  Dressings were changed. Will continue to monitor drain output.   Ivar Drape PA-C

## 2017-03-06 NOTE — Progress Notes (Signed)
@  Progress Note   Date: 03/06/2017  24 Hours: POD 1 from L4/5 MAS TLIF. Left leg strength sensation and radicular pain all improved. Feels well. C/o back soreness   Problem List Patient Active Problem List   Diagnosis Date Noted  . Lumbar radiculopathy 03/05/2017  . Flexor tenosynovitis of finger 12/19/2015    Medications: Scheduled Meds: . HYDROmorphone   Intravenous Q4H  . methocarbamol  750 mg Oral QID  . sodium chloride flush  3 mL Intravenous Q12H  . traZODone  100 mg Oral QHS  . zinc sulfate  220 mg Oral Daily   Continuous Infusions: . sodium chloride 75 mL/hr (03/06/17 0545)  . sodium chloride    . vancomycin 1,000 mg (03/06/17 0713)   PRN Meds:.acetaminophen **OR** acetaminophen, diphenhydrAMINE **OR** diphenhydrAMINE, naloxone **AND** sodium chloride flush, ondansetron **OR** ondansetron (ZOFRAN) IV, ondansetron (ZOFRAN) IV, oxyCODONE, senna-docusate, sodium chloride flush  Labs:  Results for orders placed or performed during the hospital encounter of 03/05/17 (from the past 24 hour(s))  CBC   Collection Time: 03/05/17  4:41 PM  Result Value Ref Range   WBC 13.7 (H) 3.6 - 11.0 K/uL   RBC 3.95 3.80 - 5.20 MIL/uL   Hemoglobin 11.6 (L) 12.0 - 16.0 g/dL   HCT 32.4 (L) 40.1 - 02.7 %   MCV 88.0 80.0 - 100.0 fL   MCH 29.4 26.0 - 34.0 pg   MCHC 33.4 32.0 - 36.0 g/dL   RDW 25.3 66.4 - 40.3 %   Platelets 230 150 - 440 K/uL  Basic metabolic panel   Collection Time: 03/05/17  4:41 PM  Result Value Ref Range   Sodium 138 135 - 145 mmol/L   Potassium 4.1 3.5 - 5.1 mmol/L   Chloride 102 101 - 111 mmol/L   CO2 23 22 - 32 mmol/L   Glucose, Bld 194 (H) 65 - 99 mg/dL   BUN 12 6 - 20 mg/dL   Creatinine, Ser 4.74 0.44 - 1.00 mg/dL   Calcium 8.5 (L) 8.9 - 10.3 mg/dL   GFR calc non Af Amer >60 >60 mL/min   GFR calc Af Amer >60 >60 mL/min   Anion gap 13 5 - 15    Lab Results  Component Value Date   WBC 13.7 (H) 03/05/2017   WBC 5.9 02/12/2017   HCT 34.7 (L)  03/05/2017   HCT 40.6 02/12/2017   PLT 230 03/05/2017   PLT 250 02/12/2017    Lab Results  Component Value Date   INR 0.89 02/12/2017   APTT 30 02/12/2017   Lab Results  Component Value Date   NA 138 03/05/2017   NA 139 02/12/2017   K 4.1 03/05/2017   K 4.0 02/12/2017   BUN 12 03/05/2017   BUN 13 02/12/2017   No results found for: MG  Imaging:  Dg Lumbar Spine 2-3 Views  Result Date: 03/05/2017 CLINICAL DATA:  Status post fusion at L4 and L5.  Lumbago EXAM: LUMBAR SPINE - 2-3 VIEW COMPARISON:  Lumbar CT December 25, 2016 FINDINGS: Frontal, lateral, and spot lumbosacral lateral images were obtained. There are 5 non-rib-bearing lumbar type vertebral bodies. The patient has had posterior screw and plate fixation at L4 and L5 with disc spacer placed at L4-5. Screw tips are in the respective vertebral bodies. No fracture. There is 9 mm of anterolisthesis of L4 on L5. No other spondylolisthesis. There is moderate disc space narrowing at T12-L1 and L1-2. There is also degenerative change in the visualize lower thoracic spine.  IMPRESSION: Postoperative change at L4 and L5 with support hardware intact. Screw tips are in the respective vertebral bodies. There is spondylolisthesis at L4-5, stable. 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 Electronically Signed   By: Bretta Bang III M.D.   On: 03/05/2017 14:30   Dg Lumbar Spine 2-3 Views  Result Date: 03/05/2017 CLINICAL DATA:  Lumbar surgery EXAM: LUMBAR SPINE - 2-3 VIEW; DG C-ARM 61-120 MIN COMPARISON:  None FLUOROSCOPY TIME:  2 minutes 26 seconds FINDINGS: Two intraoperative fluoroscopic spot images are provided. Posterior lumbar interbody fusion at L4-5. IMPRESSION: Posterior lumbar interbody fusion at L4-5. Electronically Signed   By: Elige Ko   On: 03/05/2017 12:31   Dg C-arm 61-120 Min  Result Date: 03/05/2017 CLINICAL DATA:  Lumbar surgery EXAM: LUMBAR SPINE - 2-3 VIEW; DG C-ARM  61-120 MIN COMPARISON:  None FLUOROSCOPY TIME:  2 minutes 26 seconds FINDINGS: Two intraoperative fluoroscopic spot images are provided. Posterior lumbar interbody fusion at L4-5. IMPRESSION: Posterior lumbar interbody fusion at L4-5. Electronically Signed   By: Elige Ko   On: 03/05/2017 12:31    Vital Signs: Temp:  [97.5 F (36.4 C)-98.4 F (36.9 C)] 98.4 F (36.9 C) (10/16 0716) Pulse Rate:  [80-107] 80 (10/16 0716) Resp:  [10-23] 14 (10/16 0716) BP: (109-155)/(63-111) 118/73 (10/16 0716) SpO2:  [95 %-100 %] 99 % (10/16 0716) Temp (24hrs), Avg:98 F (36.7 C), Min:97.5 F (36.4 C), Max:98.4 F (36.9 C)  Weight: 68 kg (150 lb) @  Physical Exam:    Right: DF/PF 5/5 KF/E 5/5 Left: DF: 4+/5, PF 5/5 KF/E 5/5 Light touch intact Incision: c/d/i - dressing changed JP: 105  Assessment and Plan:  56yo F POD 1 from L4/5 MAS TLIF. Recovering well. Leg strength sensation and radicular pain improved. Neuro exam stable.  -f/u drain output; if diminishing remove today -dressing changed -PT/OT -mobilize -dc IVF    @ Noralee Stain, MD    NOTE: This document was prepared using a combination of digital dictation and SmartPhrase techonology. Any transcriptional errors that result from this process are unintentional.

## 2017-03-06 NOTE — Care Management (Signed)
RNCM spoke with patient following up from PT evaluation. Patient plans to return home and follow up as outpatient for PT. She denies need for DME.  No current RNCM needs.

## 2017-03-07 ENCOUNTER — Encounter: Payer: Self-pay | Admitting: Neurological Surgery

## 2017-03-07 MED ORDER — OXYCODONE HCL 5 MG PO TABS: 5.0000 mg | ORAL_TABLET | ORAL | 0 refills | Status: AC | PRN

## 2017-03-07 MED ORDER — METHOCARBAMOL 750 MG PO TABS: 750.0000 mg | ORAL_TABLET | Freq: Four times a day (QID) | ORAL | 0 refills | Status: AC

## 2017-03-07 NOTE — Discharge Summary (Signed)
  History: Teresa Dudley is here after receiving MAS TLIF for lumbar radiculopathy. Procedure was tolerated well without complications. Uneventful post-surgical recovery. Symptoms present to surgery have resolved. Updated post op imaging was unremarkable, hardware intact. Patient able to ambulate, void, and eat without issue. Dressing has remained clean since drain was removed.    Physical Exam: Vitals:   03/07/17 0436 03/07/17 0800  BP: (!) 142/75 129/78  Pulse: (!) 106 93  Resp: 19 18  Temp: 99.3 F (37.4 C) 98.6 F (37 C)  SpO2: 94% 97%    AA Ox3 CNI Skin: Incision sites intact, staple in place. Strength:5/5 throughout, sensation intact bilateral upper and lower extremities.   Data:   Recent Labs Lab 03/05/17 1641  NA 138  K 4.1  CL 102  CO2 23  BUN 12  CREATININE 0.86  GLUCOSE 194*  CALCIUM 8.5*   No results for input(s): AST, ALT, ALKPHOS in the last 168 hours.  Invalid input(s): TBILI    Recent Labs Lab 03/05/17 1641  WBC 13.7*  HGB 11.6*  HCT 34.7*  PLT 230   No results for input(s): APTT, INR in the last 168 hours.       Other tests/results:  EXAM: LUMBAR SPINE - 2-3 VIEW  COMPARISON:  Lumbar CT December 25, 2016  FINDINGS: Frontal, lateral, and spot lumbosacral lateral images were obtained. There are 5 non-rib-bearing lumbar type vertebral bodies. The patient has had posterior screw and plate fixation at L4 and L5 with disc spacer placed at L4-5. Screw tips are in the respective vertebral bodies. No fracture. There is 9 mm of anterolisthesis of L4 on L5. No other spondylolisthesis. There is moderate disc space narrowing at T12-L1 and L1-2. There is also degenerative change in the visualize lower thoracic spine.  IMPRESSION: Postoperative change at L4 and L5 with support hardware intact. Screw tips are in the respective vertebral bodies. There is spondylolisthesis at L4-5, stable. There is multilevel osteoarthritic change in the lower  thoracic and upper lumbar regions.   Electronically Signed   By: Bretta BangWilliam  Woodruff III M.D.   On: 03/05/2017 14:30 Assessment/Plan:  Teresa Dudley is doing wel after MAS TLIF. Pain will continue to be controlled with oxycodone and robaxin. Patient will follow up in 2 weeks in clinic to monitor progress. Advised patient and daughter to contact office if any questions or concerns arise.  Ivar DrapeAmanda Okechukwu Regnier PA-C Department of Neurosurgery

## 2017-03-07 NOTE — Progress Notes (Signed)
Patient is being discharged home with daughter. IVs removed with caths intact. Reviewed discharge instructions including incision care, scripts, last does of meds given. Allowed to time for questions.

## 2017-03-07 NOTE — Progress Notes (Signed)
  History: Teresa Dudley is POD2 from MAS TLIF. Pain has improved since yesterday, currently 6/10. Pain currently controlled with oxycodone, robaxin, and dilaudid for breakthrough pain. Drain output 30.   Physical Exam: Vitals:   03/06/17 2315 03/07/17 0436  BP: 132/84 (!) 142/75  Pulse: (!) 108 (!) 106  Resp: 18 19  Temp: 97.7 F (36.5 C) 99.3 F (37.4 C)  SpO2: 98% 94%    AA Ox3 CNI Skin: incision site intact. Dressing around drain wet Strength:5/5 throughout, sensation intact.   Data:   Recent Labs Lab 03/05/17 1641  NA 138  K 4.1  CL 102  CO2 23  BUN 12  CREATININE 0.86  GLUCOSE 194*  CALCIUM 8.5*   No results for input(s): AST, ALT, ALKPHOS in the last 168 hours.  Invalid input(s): TBILI    Recent Labs Lab 03/05/17 1641  WBC 13.7*  HGB 11.6*  HCT 34.7*  PLT 230   No results for input(s): APTT, INR in the last 168 hours.       Other tests/results:  EXAM: LUMBAR SPINE - 2-3 VIEW  COMPARISON:  Lumbar CT December 25, 2016  FINDINGS: Frontal, lateral, and spot lumbosacral lateral images were obtained. There are 5 non-rib-bearing lumbar type vertebral bodies. The patient has had posterior screw and plate fixation at L4 and L5 with disc spacer placed at L4-5. Screw tips are in the respective vertebral bodies. No fracture. There is 9 mm of anterolisthesis of L4 on L5. No other spondylolisthesis. There is moderate disc space narrowing at T12-L1 and L1-2. There is also degenerative change in the visualize lower thoracic spine.  IMPRESSION: Postoperative change at L4 and L5 with support hardware intact. Screw tips are in the respective vertebral bodies. There is spondylolisthesis at L4-5, stable. 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   Electronically Signed   By: Bretta BangWilliam  Woodruff III M.D.   On: 03/05/2017 14:30   Assessment/Plan:  Teresa Dudley is doing well POD 2 from MAS TLIF. Drain  output is minimal at this time and will be removed. Neuro exam remains strong and unremarkable. Pain is adequately controlled.   mobilize pain control DVT prophylaxis PT  Ivar DrapeAmanda Donterrius Santucci PA-C Department of Neurosurgery

## 2017-03-07 NOTE — Discharge Instructions (Signed)
Your surgeon has performed an operation on your lumbar spine (back) to relieve pressure on the spinal cord and/or nerves. This involved making an incision(s)  in the back and removing one or more of the discs that support your spine. Next, a small piece of bone, a titanium plate, and screws were used to fuse two or more of the vertebrae (bones) together.  The following are instructions to help in your recovery once you have been discharged from the hospital. Even if you feel well, it is important that you follow these activity guidelines. If you do not let your neck heal properly from the surgery, you can increase the chance of return of your symptoms and other complications.  * Do not take anti-inflammatory medications for 3 months after surgery (naproxen [Aleve], ibuprofen [Advil, Motrin], celecoxib [Celebrex], etc.). These medications can prevent your bones from healing properly.  Activity    No bending, lifting, or twisting (BLT). Avoid lifting objects heavier than 10 pounds (gallon milk jug).  Where possible, avoid household activities that involve lifting, bending, reaching, pushing, or pulling such as laundry, vacuuming, grocery shopping, and childcare. Try to arrange for help from friends and family for these activities while your back heals.  Increase physical activity slowly as tolerated.  Taking short walks is encouraged, but avoid strenuous exercise. Do not jog, run, bicycle, lift weights, or participate in any other exercises unless specifically allowed by your doctor.  Talk to your doctor before resuming sexual activity.  You should not drive until cleared by your doctor.  Until released by your doctor, you should not return to work or school.  You should rest at home and let your body heal.   You may shower three days after your surgery.  After showering, lightly dab your incision dry. Do not take a tub bath or go swimming until approved by your doctor at your follow-up  appointment.  If your doctor ordered a cervical collar (neck brace) for you, you should wear it whenever you are out of bed. You may remove it when lying down or sleeping, but you should wear it at all other times. Not all neck surgeries require a cervical collar.  If you smoke, we strongly recommend that you quit.  Smoking has been proven to interfere with normal bone healing and will dramatically reduce the success rate of your surgery. Please contact [facility] (800-QUIT-NOW) and use the resources at www.[facility].com for assistance in stopping smoking.  Surgical Incision   If you have a dressing on your incision, you may remove it two days after your surgery. Keep your incision area clean and dry.  If you have staples or stitches on your incision, you should have a follow up scheduled for removal. If you do not have staples or stitches, you will have steri-strips (small pieces of surgical tape) or Dermabond glue. The steri-strips/glue should begin to peel away within about a week (it is fine if the steri-strips fall off before then). If the strips are still in place one week after your surgery, you may gently remove them.  Diet           You may return to your usual diet. However, you may experience discomfort when swallowing in the first month after your surgery. This is normal. You may find that softer foods are more comfortable for you to swallow. Be sure to stay hydrated.  When to Contact us  You may experience pain in your neck and/or pain between your shoulder blades. This  is normal and should improve in the next few weeks with the help of pain medication, muscle relaxers, and rest. Some patients report that a warm compress on the back of the neck or between the shoulder blades helps.  However, should you experience any of the following, contact us immediately:  New numbness or weakness  Pain that is progressively getting worse, and is not relieved by your pain medication, muscle  relaxers, rest, and warm compresses  Bleeding, redness, swelling, pain, or drainage from surgical incision  Chills or flu-like symptoms  Fever greater than 101.0 F (38.3 C)  Inability to eat, drink fluids, or take medications  Problems with bowel or bladder functions  Difficulty breathing or shortness of breath  Warmth, tenderness, or swelling in your calf Contact Information  During office hours (Monday-Friday 9 am to 5 pm), please call your physician at [phone] and ask for Sharlot GowdaKendelyn Jean  After hours and weekends, please call the [facility] Operator at [phone] and ask for the Neurosurgery Resident On Call   For a life-threatening emergency, call 911

## 2017-03-20 ENCOUNTER — Encounter: Payer: Self-pay | Admitting: Neurological Surgery

## 2017-03-26 ENCOUNTER — Encounter: Payer: Self-pay | Admitting: Neurological Surgery

## 2017-03-27 ENCOUNTER — Encounter: Payer: Self-pay | Admitting: Neurological Surgery

## 2017-08-08 ENCOUNTER — Ambulatory Visit: Payer: BLUE CROSS/BLUE SHIELD | Admitting: Nurse Practitioner

## 2017-08-29 ENCOUNTER — Ambulatory Visit: Payer: BLUE CROSS/BLUE SHIELD | Admitting: Nurse Practitioner

## 2017-08-31 ENCOUNTER — Other Ambulatory Visit: Payer: Self-pay | Admitting: Internal Medicine

## 2017-08-31 DIAGNOSIS — Z1231 Encounter for screening mammogram for malignant neoplasm of breast: Secondary | ICD-10-CM

## 2017-09-13 ENCOUNTER — Ambulatory Visit
Admission: RE | Admit: 2017-09-13 | Discharge: 2017-09-13 | Disposition: A | Discharge status: Home / Self Care | Payer: BLUE CROSS/BLUE SHIELD | Source: Ambulatory Visit | Attending: Internal Medicine | Admitting: Internal Medicine

## 2017-09-13 DIAGNOSIS — Z1231 Encounter for screening mammogram for malignant neoplasm of breast: Secondary | ICD-10-CM

## 2017-09-18 ENCOUNTER — Other Ambulatory Visit: Payer: Self-pay | Admitting: Internal Medicine

## 2017-09-18 DIAGNOSIS — R928 Other abnormal and inconclusive findings on diagnostic imaging of breast: Secondary | ICD-10-CM

## 2017-09-18 DIAGNOSIS — N6489 Other specified disorders of breast: Secondary | ICD-10-CM

## 2017-09-26 ENCOUNTER — Ambulatory Visit
Admission: RE | Admit: 2017-09-26 | Discharge: 2017-09-26 | Disposition: A | Discharge status: Home / Self Care | Payer: BLUE CROSS/BLUE SHIELD | Source: Ambulatory Visit | Attending: Internal Medicine | Admitting: Internal Medicine

## 2017-09-26 ENCOUNTER — Ambulatory Visit: Payer: BLUE CROSS/BLUE SHIELD | Admitting: Nurse Practitioner

## 2017-09-26 DIAGNOSIS — R928 Other abnormal and inconclusive findings on diagnostic imaging of breast: Secondary | ICD-10-CM

## 2017-09-26 DIAGNOSIS — N6489 Other specified disorders of breast: Secondary | ICD-10-CM

## 2018-12-10 IMAGING — CR DG CHEST 2V
2 series · 2 of 2 positions shown · non-contrast
Comparison: None.

CLINICAL DATA: Postoperative evaluation.  Smoker.

EXAM:
CHEST  2 VIEW

[chest pa]
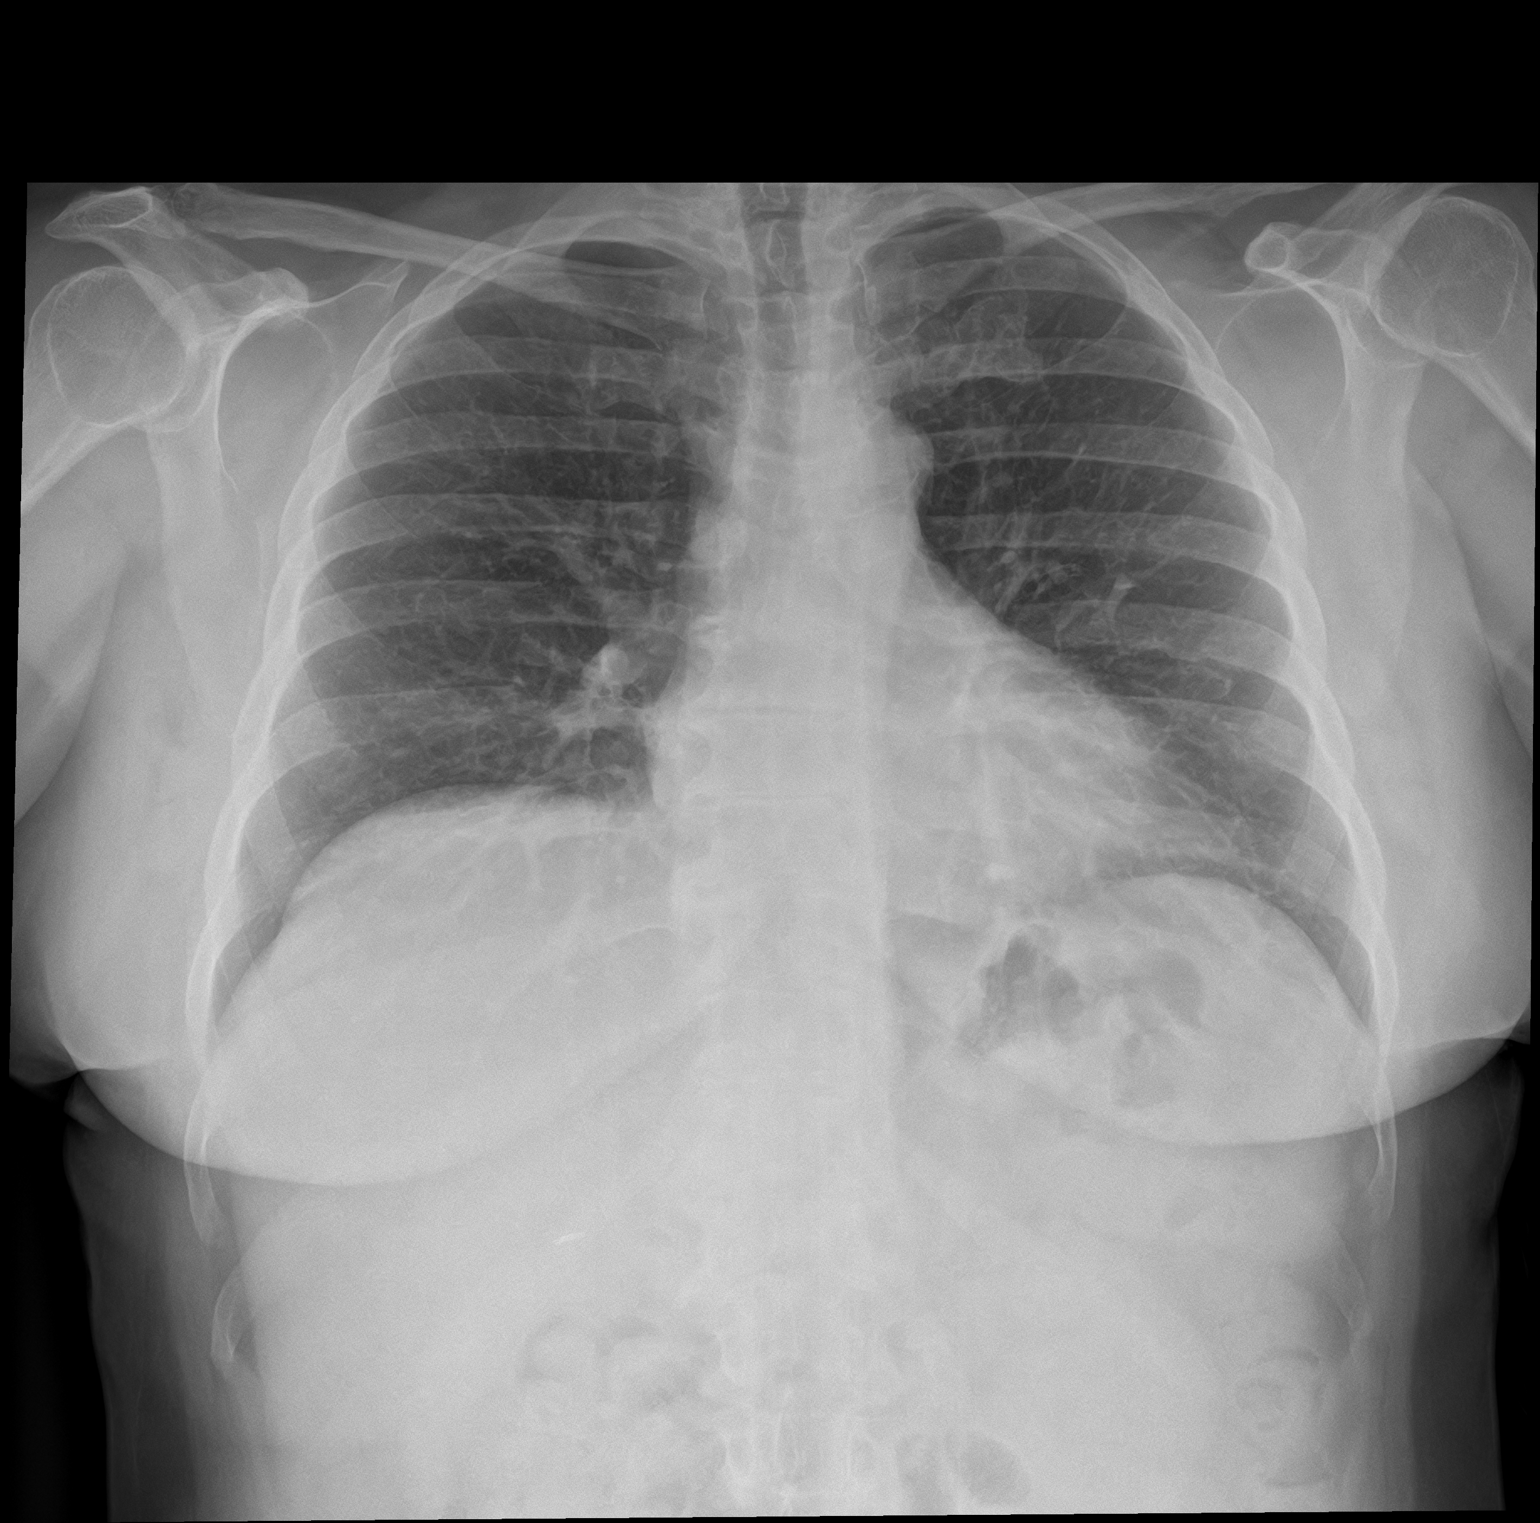

[chest lat]
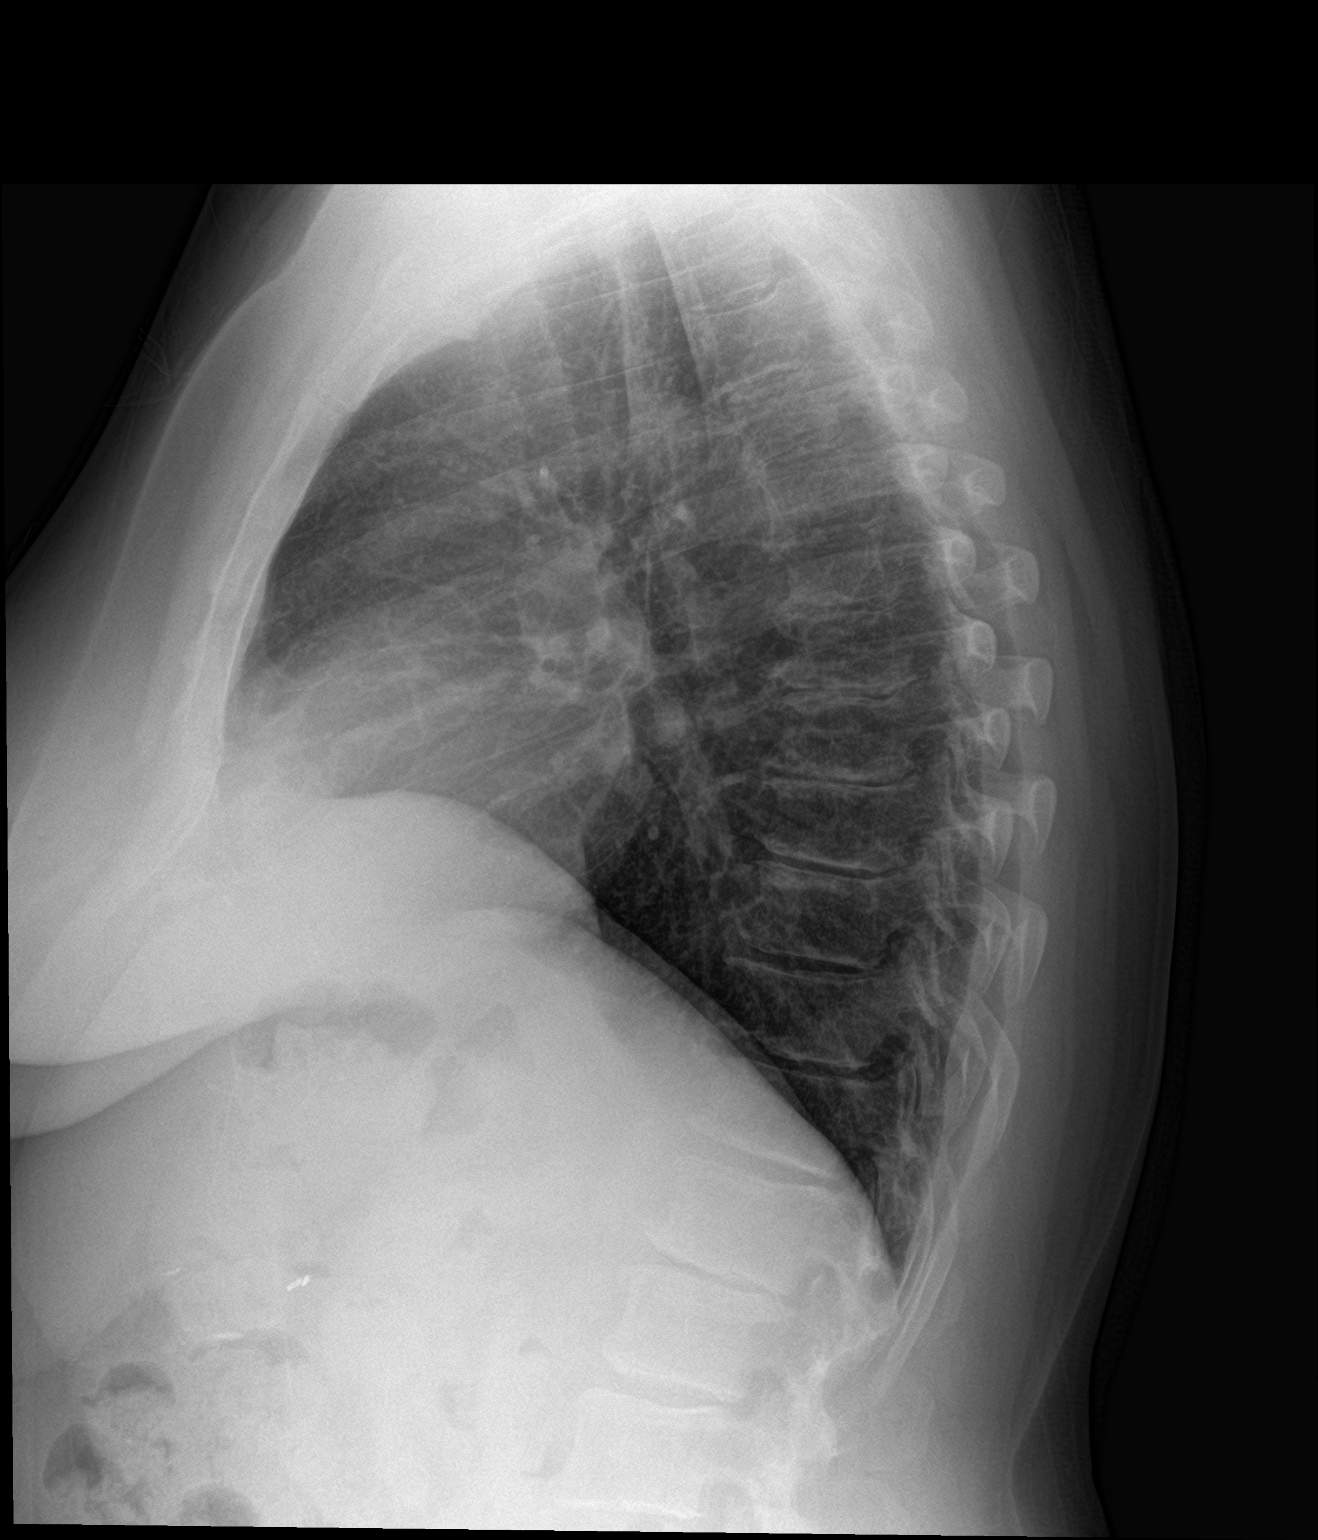

[2 of 2 positions shown; findings below may reference images not displayed]

FINDINGS: Normal cardiac and mediastinal contours. No consolidative pulmonary
opacities. No pleural effusion or pneumothorax. midthoracic spine
degenerative changes. Cholecystectomy clips.
IMPRESSION: No acute cardiopulmonary process.

## 2019-07-11 IMAGING — MG MM DIGITAL SCREENING BILAT W/ CAD
5 series · 5 of 5 positions shown · non-contrast
Comparison: Previous exam(s).

CLINICAL DATA: Screening.

EXAM:
DIGITAL SCREENING BILATERAL MAMMOGRAM WITH CAD

[R CC]
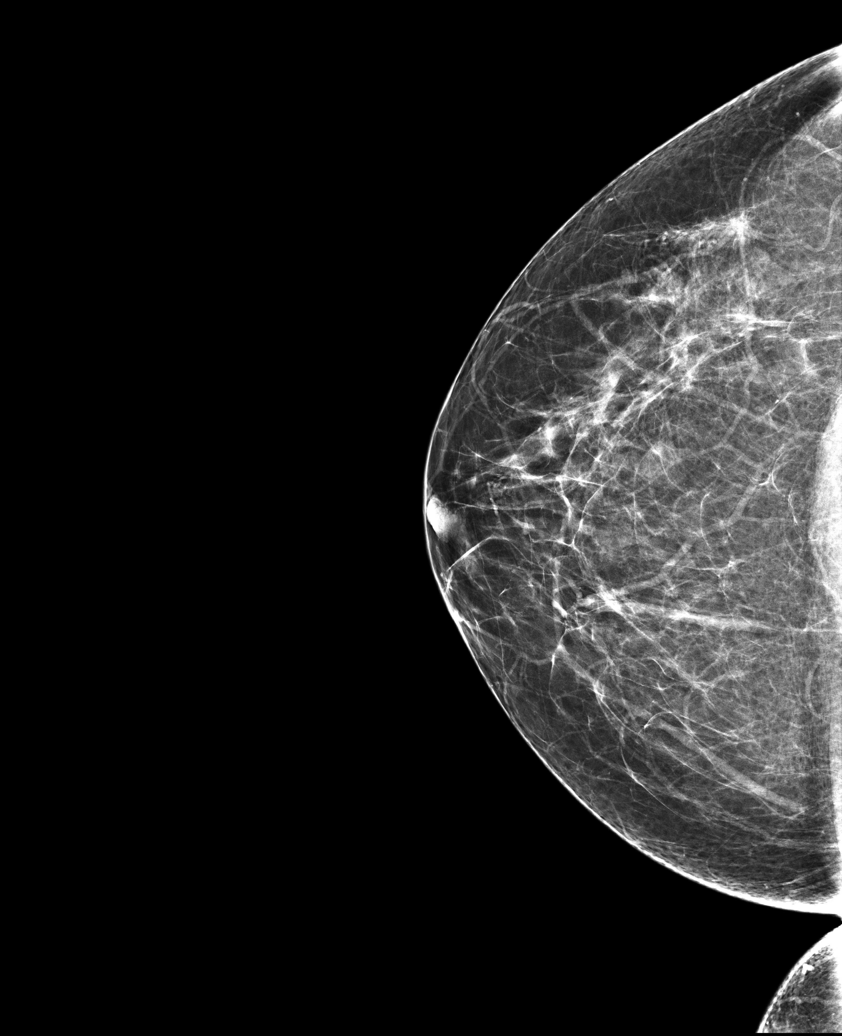

[R MLO]
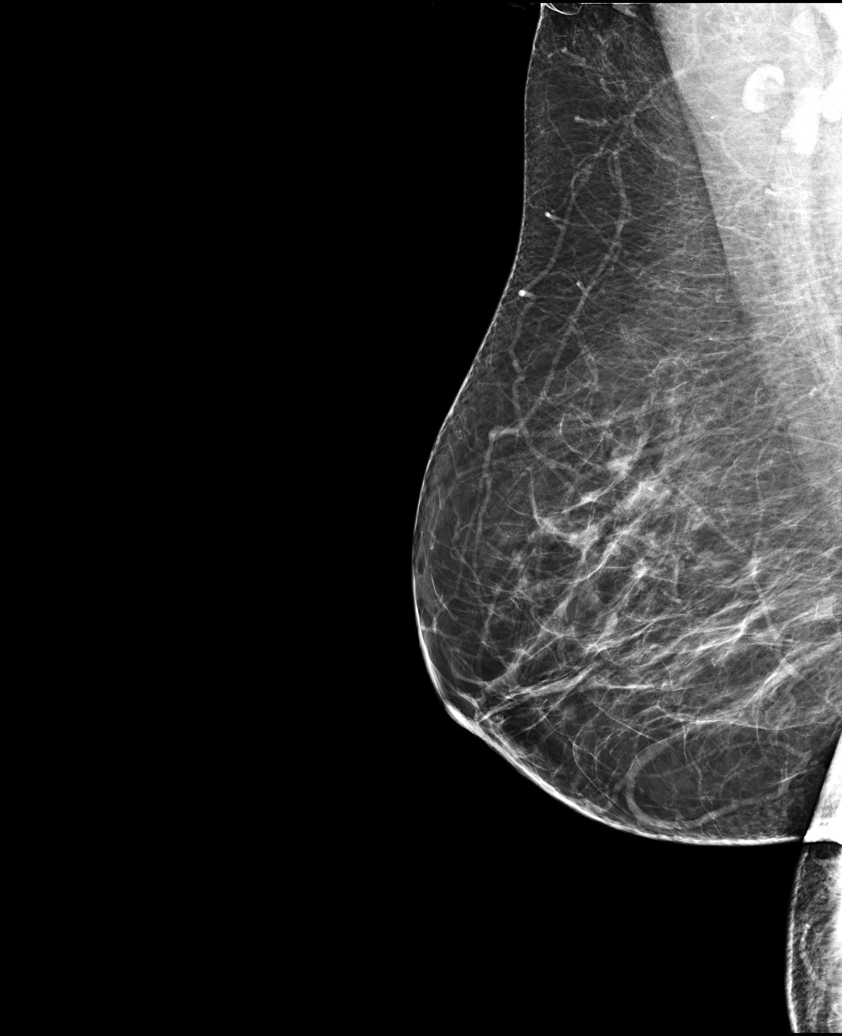

[L MLO (1 of 2)]
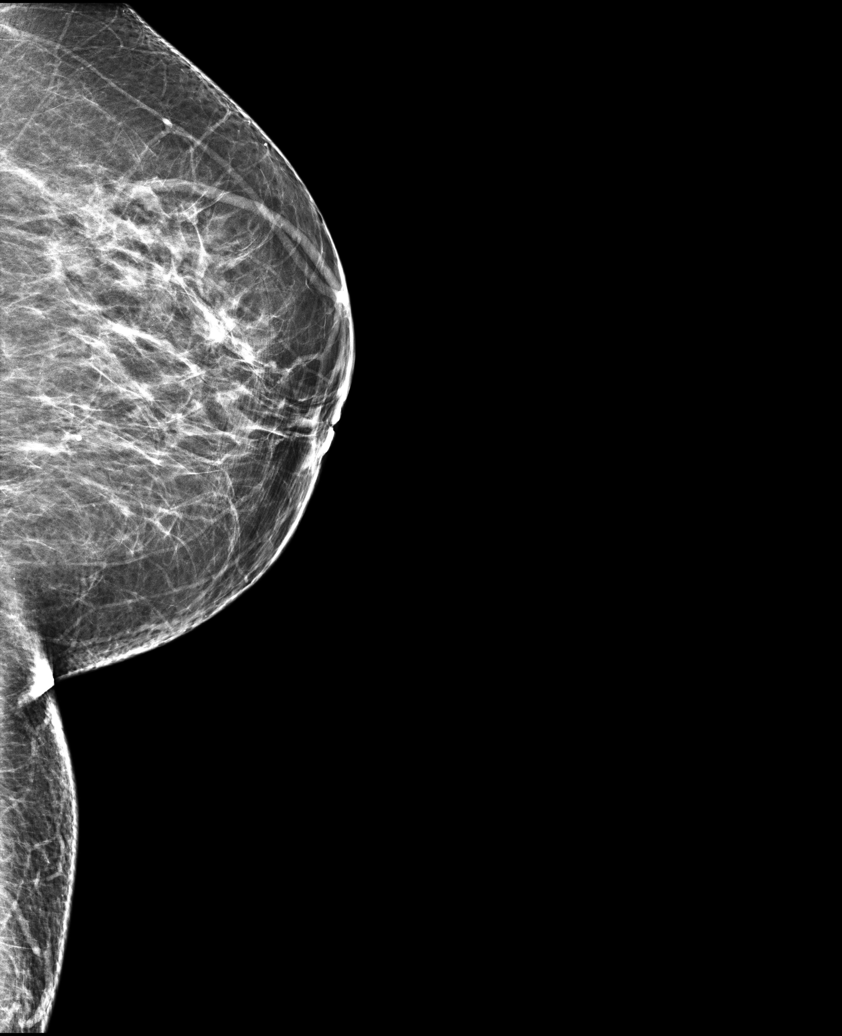

[L MLO (2 of 2)]
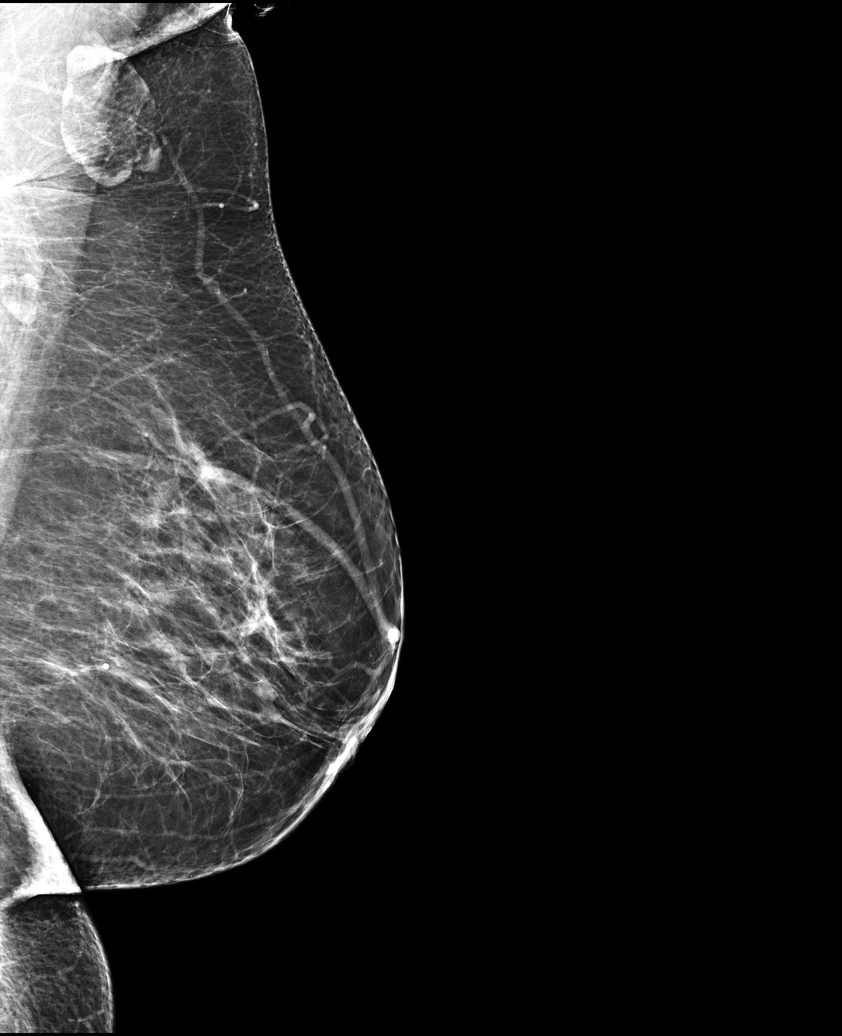

[L CC]
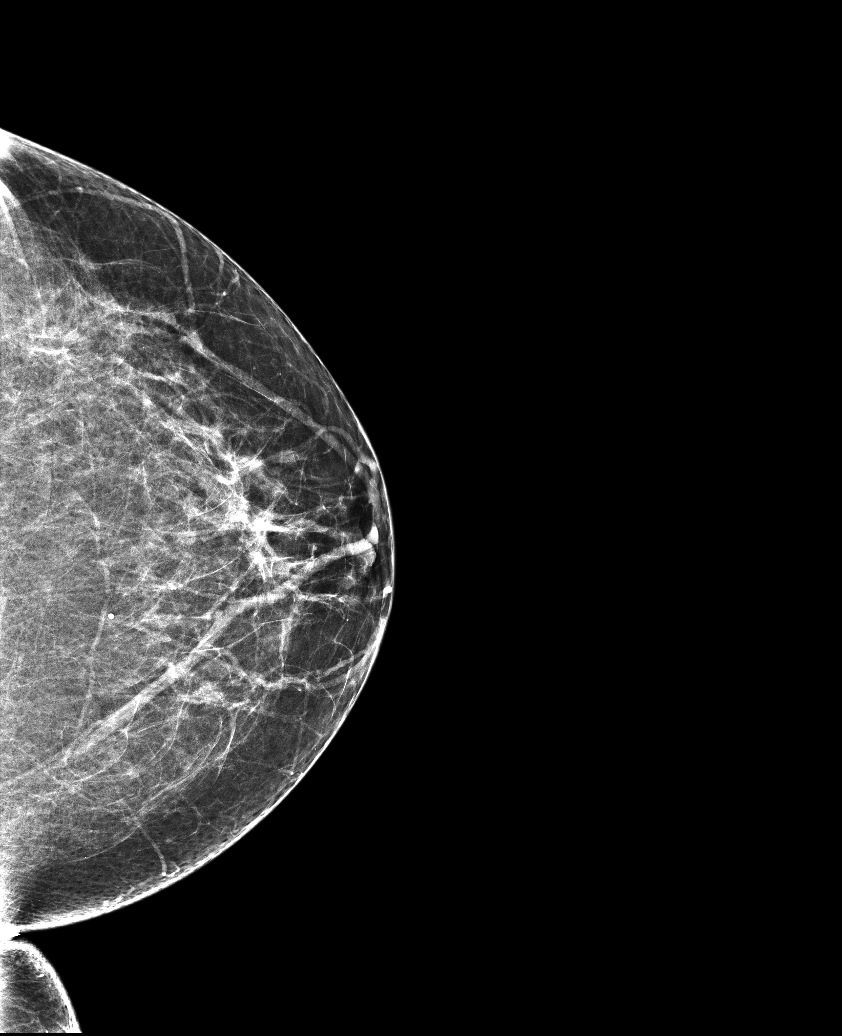

[5 of 5 positions shown; findings below may reference images not displayed]

ACR Breast Density Category b: There are scattered areas of
fibroglandular density.
FINDINGS: In the right breast, a possible asymmetry warrants further
evaluation. In the left breast, no findings suspicious for
malignancy. Images were processed with CAD.
IMPRESSION: Further evaluation is suggested for possible asymmetry in the right
breast.

RECOMMENDATION:
Diagnostic mammogram and possibly ultrasound of the right breast.
(Code:[id number])

The patient will be contacted regarding the findings, and additional
imaging will be scheduled.

BI-RADS CATEGORY  0: Incomplete. Need additional imaging evaluation
and/or prior mammograms for comparison.
